# Patient Record
Sex: Female | Born: 1985 | Hispanic: No | Marital: Single | Smoking: Current every day smoker
Health system: Southern US, Community
[De-identification: ages and names within clinical notes are randomized; demographics above are authoritative.]

## PROBLEM LIST (undated history)

## (undated) DIAGNOSIS — I10 Essential (primary) hypertension: Secondary | ICD-10-CM

## (undated) DIAGNOSIS — E559 Vitamin D deficiency, unspecified: Secondary | ICD-10-CM

## (undated) DIAGNOSIS — D649 Anemia, unspecified: Secondary | ICD-10-CM

## (undated) HISTORY — DX: Anemia, unspecified: D64.9

## (undated) HISTORY — PX: TUBAL LIGATION: SHX77

## (undated) HISTORY — DX: Essential (primary) hypertension: I10

---

## 2014-04-21 ENCOUNTER — Encounter (HOSPITAL_COMMUNITY): Payer: Self-pay | Admitting: Emergency Medicine

## 2014-04-21 DIAGNOSIS — Z8639 Personal history of other endocrine, nutritional and metabolic disease: Secondary | ICD-10-CM | POA: Insufficient documentation

## 2014-04-21 DIAGNOSIS — Z0000 Encounter for general adult medical examination without abnormal findings: Secondary | ICD-10-CM | POA: Insufficient documentation

## 2014-04-21 DIAGNOSIS — Z720 Tobacco use: Secondary | ICD-10-CM | POA: Insufficient documentation

## 2014-04-21 NOTE — ED Notes (Signed)
Pt sts she was having intercourse and after she was unable to find the condom her and her partner were using. Denies pain/discomfort.

## 2014-04-22 ENCOUNTER — Emergency Department (HOSPITAL_COMMUNITY)
Admission: EM | Admit: 2014-04-22 | Discharge: 2014-04-22 | Disposition: A | Discharge status: Home / Self Care | Payer: Self-pay | Attending: Emergency Medicine | Admitting: Emergency Medicine

## 2014-04-22 DIAGNOSIS — Z0000 Encounter for general adult medical examination without abnormal findings: Secondary | ICD-10-CM

## 2014-04-22 HISTORY — DX: Vitamin D deficiency, unspecified: E55.9

## 2014-04-22 NOTE — ED Provider Notes (Signed)
CSN: [id number]     Arrival date & time 04/21/14  2254 History   First MD Initiated Contact with Patient 04/22/14 [phone]     Chief Complaint  Patient presents with  . Foreign Body in Vagina     (Consider location/radiation/quality/duration/timing/severity/associated sxs/prior Treatment) Patient is a 29 y.o. female presenting with foreign body in vagina. The history is provided by the patient. No language interpreter was used.  Foreign Body in Vagina This is a new problem. The current episode started yesterday. Pertinent negatives include no abdominal pain, fever or myalgias. Associated symptoms comments: She reports having intercourse with her friend and being unable to find the condom they were using. No vaginal discharge, pelvic pain, bleeding or abdominal pain. No dysuria or fever..    Past Medical History  Diagnosis Date  . Vitamin D deficiency disease    Past Surgical History  Procedure Laterality Date  . Cesarean section     No family history on file. History  Substance Use Topics  . Smoking status: Current Every Day Smoker  . Smokeless tobacco: Not on file  . Alcohol Use: Yes   OB History    No data available     Review of Systems  Constitutional: Negative for fever.  Gastrointestinal: Negative for abdominal pain.  Genitourinary: Negative for vaginal bleeding, vaginal discharge and pelvic pain.       See HPI.  Musculoskeletal: Negative for myalgias.      Allergies  Review of patient's allergies indicates no known allergies.  Home Medications   Prior to Admission medications   Not on File   BP 126/81 mmHg  Pulse 78  Temp(Src) 98.5 F (36.9 C) (Oral)  Resp 18  SpO2 100%  LMP 03/29/2014 (Approximate) Physical Exam  Constitutional: She is oriented to person, place, and time. She appears well-developed and well-nourished.  Neck: Normal range of motion.  Pulmonary/Chest: Effort normal.  Genitourinary:  No foreign body visualized vaginally. No discharge.  No CMT, adnexal mass or tenderness.   Neurological: She is alert and oriented to person, place, and time.  Skin: Skin is warm and dry.    ED Course  Procedures (including critical care time) Labs Review Labs Reviewed - No data to display  Imaging Review No results found.   EKG Interpretation None      MDM   Final diagnoses:  None    1. Normal exam  No foreign body found.     Arnoldo HookerShari A Shaivi Rothschild, PA-C 04/22/14 0345  Tomasita CrumbleAdeleke Oni, MD 04/22/14 [id number]

## 2014-04-22 NOTE — ED Notes (Signed)
PA at bedside for pelvic exam.

## 2014-04-22 NOTE — Discharge Instructions (Signed)
FOLLOW UP WITH A PRIMARY CARE PROVIDER OR RETURN HERE WITH ANY NEW CONCERNS.

## 2014-06-07 ENCOUNTER — Encounter (HOSPITAL_COMMUNITY): Payer: Self-pay | Admitting: *Deleted

## 2014-06-07 ENCOUNTER — Emergency Department (HOSPITAL_COMMUNITY): Payer: Self-pay

## 2014-06-07 ENCOUNTER — Emergency Department (HOSPITAL_COMMUNITY)
Admission: EM | Admit: 2014-06-07 | Discharge: 2014-06-07 | Disposition: A | Discharge status: Home / Self Care | Payer: Self-pay | Attending: Emergency Medicine | Admitting: Emergency Medicine

## 2014-06-07 DIAGNOSIS — Z8639 Personal history of other endocrine, nutritional and metabolic disease: Secondary | ICD-10-CM | POA: Insufficient documentation

## 2014-06-07 DIAGNOSIS — J069 Acute upper respiratory infection, unspecified: Secondary | ICD-10-CM | POA: Insufficient documentation

## 2014-06-07 DIAGNOSIS — N898 Other specified noninflammatory disorders of vagina: Secondary | ICD-10-CM | POA: Insufficient documentation

## 2014-06-07 DIAGNOSIS — Z3202 Encounter for pregnancy test, result negative: Secondary | ICD-10-CM | POA: Insufficient documentation

## 2014-06-07 DIAGNOSIS — Z720 Tobacco use: Secondary | ICD-10-CM | POA: Insufficient documentation

## 2014-06-07 LAB — RAPID HIV SCREEN (HIV 1/2 AB+AG)
HIV 1/2 Antibodies: NONREACTIVE
HIV-1 P24 Antigen - HIV24: NONREACTIVE

## 2014-06-07 LAB — URINALYSIS, ROUTINE W REFLEX MICROSCOPIC
Bilirubin Urine: NEGATIVE
Glucose, UA: NEGATIVE mg/dL
HGB URINE DIPSTICK: NEGATIVE
Ketones, ur: NEGATIVE mg/dL
Leukocytes, UA: NEGATIVE
Nitrite: NEGATIVE
PROTEIN: NEGATIVE mg/dL
Specific Gravity, Urine: 1.003 — ABNORMAL LOW (ref 1.005–1.030)
UROBILINOGEN UA: 0.2 mg/dL (ref 0.0–1.0)
pH: 6.5 (ref 5.0–8.0)

## 2014-06-07 LAB — WET PREP, GENITAL
Trich, Wet Prep: NONE SEEN
WBC WET PREP: NONE SEEN
YEAST WET PREP: NONE SEEN

## 2014-06-07 LAB — POC URINE PREG, ED: Preg Test, Ur: NEGATIVE

## 2014-06-07 MED ORDER — IBUPROFEN 800 MG PO TABS: 800.0000 mg | ORAL_TABLET | Freq: Three times a day (TID) | ORAL | Status: DC | PRN

## 2014-06-07 MED ORDER — AZITHROMYCIN 250 MG PO TABS
1000.0000 mg | ORAL_TABLET | Freq: Once | ORAL | Status: AC
  Administered 2014-06-07: 1000 mg via ORAL
  Filled 2014-06-07: qty 4

## 2014-06-07 MED ORDER — CEFTRIAXONE SODIUM 250 MG IJ SOLR
250.0000 mg | Freq: Once | INTRAMUSCULAR | Status: AC
  Administered 2014-06-07: 250 mg via INTRAMUSCULAR
  Filled 2014-06-07: qty 250

## 2014-06-07 MED ORDER — GUAIFENESIN ER 1200 MG PO TB12: 1.0000 | ORAL_TABLET | Freq: Two times a day (BID) | ORAL | Status: DC

## 2014-06-07 MED ORDER — ACETAMINOPHEN-CODEINE 120-12 MG/5ML PO SOLN: 10.0000 mL | ORAL | Status: DC | PRN

## 2014-06-07 MED ORDER — LIDOCAINE HCL (PF) 1 % IJ SOLN
5.0000 mL | Freq: Once | INTRAMUSCULAR | Status: AC
  Administered 2014-06-07: 5 mL
  Filled 2014-06-07: qty 5

## 2014-06-07 NOTE — ED Notes (Signed)
The pt has taken nher meds

## 2014-06-07 NOTE — ED Provider Notes (Signed)
CSN: [id number]     Arrival date & time 06/07/14  1631 History   First MD Initiated Contact with Patient 06/07/14 1755     Chief Complaint  Patient presents with  . Sore Throat  . Nasal Congestion  . Vaginal Discharge     (Consider location/radiation/quality/duration/timing/severity/associated sxs/prior Treatment) HPI   Ms. Tiffany Weiss is a 29 yo female who presents to the ED with chest congestion, non-productive cough, subjective fever and chills x 1 day and 2 days of vaginal discharge. She also complains of sore throat and mild joint aches that started this morning. Her daughter has been sick recently. She took a Zyrtec at home which did not relieve her symptoms. She denies SOB, chest pain, headache, dizziness, syncope, n/d/c, abdominal pain. She denies any vaginal pruritus, pain, rash or lesions. Nor urinary frequency or dysuria. She does not believe she was exposed to STIs but does desire testing today.   Past Medical History  Diagnosis Date  . Vitamin D deficiency disease    Past Surgical History  Procedure Laterality Date  . Cesarean section     No family history on file. History  Substance Use Topics  . Smoking status: Current Every Day Smoker  . Smokeless tobacco: Not on file  . Alcohol Use: Yes   OB History    No data available     Review of Systems  All other systems negative except as documented in the HPI. All pertinent positives and negatives as reviewed in the HPI.   Allergies  Review of patient's allergies indicates no known allergies.  Home Medications   Prior to Admission medications   Not on File   BP 134/90 mmHg  Pulse 90  Temp(Src) 99.7 F (37.6 C) (Oral)  Resp 16  Wt 166 lb 1.6 oz (75.342 kg)  SpO2 97% Physical Exam  Constitutional: She is oriented to person, place, and time. She appears well-developed.  HENT:  Mouth/Throat: Oropharynx is clear and moist and mucous membranes are normal.  Eyes: Conjunctivae are normal. Pupils are equal, round,  and reactive to light.  Neck: Neck supple. No thyromegaly present.  Cardiovascular: Normal rate, regular rhythm, S1 normal and S2 normal.   Pulmonary/Chest: Effort normal and breath sounds normal. She has no rhonchi. She has no rales.  Abdominal: Soft. Bowel sounds are normal. There is no tenderness.  Genitourinary: Uterus normal. Cervix exhibits no motion tenderness. Vaginal discharge found.  No adnexal tenderness. No rash or lesions on the labia  Lymphadenopathy:    She has no cervical adenopathy.  Neurological: She is alert and oriented to person, place, and time.  Skin: Skin is warm and dry.    ED Course  Procedures (including critical care time) Labs Review Labs Reviewed  URINALYSIS, ROUTINE W REFLEX MICROSCOPIC - Abnormal; Notable for the following:    Specific Gravity, Urine 1.003 (*)    All other components within normal limits  WET PREP, GENITAL  RPR  RAPID HIV SCREEN (HIV 1/2 AB+AG)  POC URINE PREG, ED  GC/CHLAMYDIA PROBE AMP ([place])    Patient be treated for possible STD.  Told to return here as needed.  Told to follow up with a GYN doctor MDM   Final diagnoses:  URI (upper respiratory infection)       Charlestine Nighthristopher Kaitlin Ardito, PA-C 06/10/14 [id number]  Glynn OctaveStephen Rancour, MD 06/10/14 [phone]

## 2014-06-07 NOTE — ED Notes (Signed)
Pt child being seen first.

## 2014-06-07 NOTE — ED Notes (Signed)
Pelvic performed.  Specs to the lab

## 2014-06-07 NOTE — Discharge Instructions (Signed)
Return here as needed. Follow up with your doctor. °

## 2014-06-07 NOTE — ED Notes (Signed)
Pt is c/o congestion, cough.  She said she had a fever today.  Pt is also requesting a STD check and HIV test.  Denies any abd pain.  Says she has had some vaginal discharge.  Pt is c/o generalized body aches.  She took a zyrtec today.  Pt took aspirin about 1 hour ago.

## 2014-06-08 LAB — GC/CHLAMYDIA PROBE AMP (~~LOC~~) NOT AT ARMC
Chlamydia: NEGATIVE
NEISSERIA GONORRHEA: NEGATIVE

## 2014-06-08 LAB — RPR: RPR Ser Ql: NONREACTIVE

## 2014-07-13 ENCOUNTER — Emergency Department (HOSPITAL_COMMUNITY)
Admission: EM | Admit: 2014-07-13 | Discharge: 2014-07-13 | Disposition: A | Discharge status: Home / Self Care | Payer: Self-pay | Attending: Emergency Medicine | Admitting: Emergency Medicine

## 2014-07-13 ENCOUNTER — Encounter (HOSPITAL_COMMUNITY): Payer: Self-pay | Admitting: Emergency Medicine

## 2014-07-13 ENCOUNTER — Emergency Department (HOSPITAL_COMMUNITY): Payer: Self-pay

## 2014-07-13 DIAGNOSIS — Z792 Long term (current) use of antibiotics: Secondary | ICD-10-CM | POA: Insufficient documentation

## 2014-07-13 DIAGNOSIS — M79674 Pain in right toe(s): Secondary | ICD-10-CM | POA: Insufficient documentation

## 2014-07-13 DIAGNOSIS — M25511 Pain in right shoulder: Secondary | ICD-10-CM | POA: Insufficient documentation

## 2014-07-13 DIAGNOSIS — Z720 Tobacco use: Secondary | ICD-10-CM | POA: Insufficient documentation

## 2014-07-13 MED ORDER — IBUPROFEN 800 MG PO TABS: 800.0000 mg | ORAL_TABLET | Freq: Three times a day (TID) | ORAL | Status: DC

## 2014-07-13 MED ORDER — CEPHALEXIN 500 MG PO CAPS: 500.0000 mg | ORAL_CAPSULE | Freq: Four times a day (QID) | ORAL | Status: DC

## 2014-07-13 NOTE — Discharge Instructions (Signed)
Arthralgia °Your caregiver has diagnosed you as suffering from an arthralgia. Arthralgia means there is pain in a joint. This can come from many reasons including: °· Bruising the joint which causes soreness (inflammation) in the joint. °· Wear and tear on the joints which occur as we grow older (osteoarthritis). °· Overusing the joint. °· Various forms of arthritis. °· Infections of the joint. °Regardless of the cause of pain in your joint, most of these different pains respond to anti-inflammatory drugs and rest. The exception to this is when a joint is infected, and these cases are treated with antibiotics, if it is a bacterial infection. °HOME CARE INSTRUCTIONS  °· Rest the injured area for as long as directed by your caregiver. Then slowly start using the joint as directed by your caregiver and as the pain allows. Crutches as directed may be useful if the ankles, knees or hips are involved. If the knee was splinted or casted, continue use and care as directed. If an stretchy or elastic wrapping bandage has been applied today, it should be removed and re-applied every 3 to 4 hours. It should not be applied tightly, but firmly enough to keep swelling down. Watch toes and feet for swelling, bluish discoloration, coldness, numbness or excessive pain. If any of these problems (symptoms) occur, remove the ace bandage and re-apply more loosely. If these symptoms persist, contact your caregiver or return to this location. °· For the first 24 hours, keep the injured extremity elevated on pillows while lying down. °· Apply ice for 15-20 minutes to the sore joint every couple hours while awake for the first half day. Then 03-04 times per day for the first 48 hours. Put the ice in a plastic bag and place a towel between the bag of ice and your skin. °· Wear any splinting, casting, elastic bandage applications, or slings as instructed. °· Only take over-the-counter or prescription medicines for pain, discomfort, or fever as  directed by your caregiver. Do not use aspirin immediately after the injury unless instructed by your physician. Aspirin can cause increased bleeding and bruising of the tissues. °· If you were given crutches, continue to use them as instructed and do not resume weight bearing on the sore joint until instructed. °Persistent pain and inability to use the sore joint as directed for more than 2 to 3 days are warning signs indicating that you should see a caregiver for a follow-up visit as soon as possible. Initially, a hairline fracture (break in bone) may not be evident on X-rays. Persistent pain and swelling indicate that further evaluation, non-weight bearing or use of the joint (use of crutches or slings as instructed), or further X-rays are indicated. X-rays may sometimes not show a small fracture until a week or 10 days later. Make a follow-up appointment with your own caregiver or one to whom we have referred you. A radiologist (specialist in reading X-rays) may read your X-rays. Make sure you know how you are to obtain your X-ray results. Do not assume everything is normal if you do not hear from us. °SEEK MEDICAL CARE IF: °Bruising, swelling, or pain increases. °SEEK IMMEDIATE MEDICAL CARE IF:  °· Your fingers or toes are numb or blue. °· The pain is not responding to medications and continues to stay the same or get worse. °· The pain in your joint becomes severe. °· You develop a fever over 102° F (38.9° C). °· It becomes impossible to move or use the joint. °MAKE SURE YOU:  °·   Understand these instructions. °· Will watch your condition. °· Will get help right away if you are not doing well or get worse. °Document Released: 02/03/2005 Document Revised: 04/28/2011 Document Reviewed: 09/22/2007 °ExitCare® Patient Information ©2015 [employer]. This information is not intended to replace advice given to you by your health care provider. Make sure you discuss any questions you have with your health care  provider. ° ° ° °Emergency Department Resource Guide °1) Find a Doctor and Pay Out of Pocket °Although you won't have to find out who is covered by your insurance plan, it is a good idea to ask around and get recommendations. You will then need to call the office and see if the doctor you have chosen will accept you as a new patient and what types of options they offer for patients who are self-pay. Some doctors offer discounts or will set up payment plans for their patients who do not have insurance, but you will need to ask so you aren't surprised when you get to your appointment. ° °2) Contact Your Local Health Department °Not all health departments have doctors that can see patients for sick visits, but many do, so it is worth a call to see if yours does. If you don't know where your local health department is, you can check in your phone book. The CDC also has a tool to help you locate your state's health department, and many state websites also have listings of all of their local health departments. ° °3) Find a Walk-in Clinic °If your illness is not likely to be very severe or complicated, you may want to try a walk in clinic. These are popping up all over the country in pharmacies, drugstores, and shopping centers. They're usually staffed by nurse practitioners or physician assistants that have been trained to treat common illnesses and complaints. They're usually fairly quick and inexpensive. However, if you have serious medical issues or chronic medical problems, these are probably not your best option. ° °No Primary Care Doctor: °- Call [facility] at  832-8000 - they can help you locate a primary care doctor that  accepts your insurance, provides certain services, etc. °- Physician Referral Service- 1-800-533-3463 ° °Chronic Pain Problems: °Organization         Address  Phone   Notes  ° [facility]  (336) 297-2271 Patients need to be referred by their primary care doctor.   ° °Medication Assistance: °Organization         Address  Phone   Notes  °[place] [facility] [address] °[place], [place] [address] (336) 641-8030 --Must be a resident of [place] °-- Must have NO insurance coverage whatsoever (no Medicaid/ Medicare, etc.) °-- The pt. MUST have a primary care doctor that directs their care regularly and follows them in the community °  °[facility]  (866) 331-1348   °[employer]  (888) 892-1162   ° °Agencies that provide inexpensive medical care: °Organization         Address  Phone   Notes  °[facility]  (336) 832-8035   °[facility]    (336) 832-7272   °[facility] [address] °[place], [place] [address] (336) 832-4777   °[facility] [address], °[place] (336) 271-4999   °[facility]    (336) 373-0678   °[facility]    (336) 272-1050   °[facility] °   [address], [place] Phone:  (336) 832-4444, Fax:  (336) 832-4440 Hours of Operation:  9 am - 6 pm, M-F.  Also accepts Medicaid/Medicare and self-pay.  °[facility] ° [address], [place] Phone: (336) 832-3150, Fax: (336) [phone]. Hours of Operation:  8:30 am - 5:30 pm, M-F.  Also accepts Medicaid and self-pay.  °[facility] [place] [address], [place] Phone: (336) 878-6027   °[facility] [address], [place], [place] (336)723-1848, Ext. 123 Mondays & Thursdays: 7-9 AM.  First 15 patients are seen on a first come, first serve basis. °  ° °Medicaid-accepting [place] Providers: ° °Organization         Address  Phone   Notes  °[facility] [address], [place] (336) 641-2100 Also accepts self-pay patients.  °[facility] [address], [place] ° (336) 856-9996   °[facility] [address], [place] (336) 288-8857   °[facility] [address] [place] [address], [place] (336) 299-7000   °Veita Bland [address], [place]  ° (336) 373-1557 Only accepts [place] Access Medicaid patients after they have their name applied to their card.  ° °Self-Pay (no insurance) in [place]: ° °Organization         Address  Phone   Notes  °Sickle Cell Patients, [facility] [address], [place] (336) 832-1970   °[facility] [address], [place] (336) 832-4400   °[facility] ° [address], [place] (336) 992-4800   °[facility]/Dr. Osei-Bonsu ° [address] [place] [address], [place] or [address], [place] (336) [phone] Phone number for both [place] and [place] locations is the same.  °[facility] [address], [place] (336) 299-0000   °[facility] [address] [place] [address], [place] or [address] (336) 852-7530 °(336) 878-2260   °[facility] [address], [place] (336) 350-1642, phone; (336) 294-5005, fax Sees patients 1st and 3rd Saturday of every month.  Must not qualify for public or private insurance (i.e. Medicaid, Medicare, [place] Health Choice, Veterans' Benefits) • Household income should be no more than 200% of the poverty level •The clinic cannot treat you if you are pregnant or think you are pregnant • Sexually transmitted diseases are not treated at the clinic.  ° ° °Dental Care: °Organization         Address  Phone  Notes  °[facility] [address], [place] (336) 641-6152 Accepts children up to age 21 who are enrolled in Medicaid or [place] Health Choice; pregnant women with a Medicaid card; and children who have applied for Medicaid or [place] Health Choice, but were declined, whose parents can pay a reduced fee at time of service.    °[facility] [place]  [address], [place] (336) 641-7733 Accepts children up to age 21 who are enrolled in Medicaid or [place] Health Choice; pregnant women with a Medicaid card; and children who have applied for Medicaid or [place] Health Choice, but were declined, whose parents can pay a reduced fee at time of service.  °[facility] ° [address],  (336)   641-4533 Patients are seen by appointment only. Walk-ins are not accepted. [facility] will see patients 18 years of age and older. °Monday - Tuesday (8am-5pm) °Most Wednesdays (8:30-5pm) °$30 per visit, cash only  °[facility] ° [address], [place] (336) 641-4533 Patients are seen by appointment only. Walk-ins are not accepted. [facility] will see patients 18 years of age and older. °One Wednesday Evening (Monthly: Volunteer Based).  $30 per visit, cash only  °[facility]  (919) 537-3737 for adults; Children under age 4, call Graduate Pediatric Dentistry at (919) [phone]. Children aged 4-14, please call (919) [phone] to request a pediatric application. ° Dental services are provided in all areas of dental care including fillings, crowns and bridges, complete and partial dentures, implants, gum treatment, root canals, and extractions. Preventive care is also provided. Treatment is provided to both adults and children. °Patients are selected via a lottery and there is often a waiting list. °  °[facility] [address], °[place] ° (336) 763-8833 [email or web address] °  °[facility] [address], [place], [place] (336)723-1848, Ext. 123 Second and Fourth Thursday of each month, opens at 6:30 AM; Clinic ends at 9 AM.  Patients are seen on a first-come first-served basis, and a limited number are seen during each clinic.  ° °[facility] ° [address], [place], [place] (336)  723-7904   Eligibility Requirements °You must have lived in [place], [place], or [place] counties for at least the last three months. °  You cannot be eligible for state or federal sponsored healthcare insurance, including Veterans Administration, Medicaid, or Medicare. °  You generally cannot be eligible for healthcare insurance through your employer.  °  How to apply: °Eligibility screenings are held every Tuesday and Wednesday afternoon from 1:00 pm until 4:00 pm. You do not need an appointment for the interview!  °[facility] [address], [place], [place] 336-631-2330   °[facility]  336-342-8273   °[facility]  336-703-3100   °[facility]  336-570-6415   ° °Behavioral Health Resources in the Community: °Intensive Outpatient Programs °Organization         Address  Phone  Notes  °[facility] [address], [place], [place] 336-878-6098   °[facility] [address], [place], [place] 336-832-9800   °[facility] [address], [place], [place] ° 336-882-2125   °[facility] [address],  °[place], [place] 1-800-853-5163 or 336-641-4981   °Substance Abuse Resources °Organization         Address  Phone  Notes  °[facility]  336-882-2125   °[facility]  336-784-9470   °[facility]  336-285-9073   °[facility]  336-845-3988   °Residential & Outpatient Substance Abuse Program  1-800-659-3381   °Psychological Services °Organization         Address  Phone  Notes  °[facility]  336- 832-9600   °[facility]  336- 378-7881   °[facility] [address], [place] 1-800-853-5163 or 336-641-4981   ° °Mobile Crisis Teams °Organization         Address  Phone  Notes  °[facility], Mobile Crisis Care Unit  1-877-626-1772   °Assertive °[facility] ° [address] [place], [place] 336-834-9664   °Sharon DeEsch [address] °[place] 336-554-5454   ° °Self-Help/Support Groups °Organization         Address  Phone             Notes  °[facility] [place] - variety of support groups  336- 373-1402 Call for more information  °Narcotics Anonymous (NA), [facility] [address], °[place]  2 meetings at this location  ° °Residential Treatment Programs °Organization         Address  Phone  Notes  °[facility] [address],    °[place]  1-866-801-8205   °[facility] ° [address], [place], [place] 704-293-8524   °[facility] [address], [place] 336-845-3988 Admissions: 8am-3pm M-F  °[facility] [address]    °[place], [place] 336-841-1104   °[facility] [address], [place], [place] 336-379-7146   °[facility] [address]  °[place], [place] 336-285-9073   °[facility] - Intensive Outpatient [address], , [place] 336-852-3033   °[facility] [address]  °[place], [place] 1-877-615-2722 or 336-784-9470   °[facility] [address] [place], [place] 336-227-7417 Accepts Medicaid  °[facility] [address]  ° [place] 1-800-659-3381 Substance Abuse/Addiction Treatment  ° °[place] Behavioral Health Resources °Organization         Address  Phone  Notes  °[facility]  (888) 581-9988   °Julie Brannon, PhD [address] [place], [place]   (336) 349-5553 or (336) 951-0000   °[facility]   [address] °[place], [place] (336) 349-4454   °[facility] [address], [place], [place] (336) 342-8316 Insurance/Medicaid/sponsorship through [facility]  °[facility] [address]                                    [place], [place] (336) 342-8316  Therapy/tele-psych/case  °[facility] [address]  ° [place], [place] (336) 349-2233    °Dr. Arfeen  (336) 349-4544   °[facility] [place]  [employer] [place] [facility] 1) [address], [place] °2) [address], [place] °3)  [address], [place] (336) 349-3220 °(336) 342-7768 ° °(336) 342-8140   °[place] Child Abuse Hotline (336) 342-1394 or (336) 342-3537 (After Hours)    ° ° ° ° °

## 2014-07-13 NOTE — ED Provider Notes (Signed)
CSN: [id number]     Arrival date & time 07/13/14  1313 History  This chart was scribed for non-physician practitioner, Jinny Sanders, PA-C working with Pricilla Loveless, MD, by Abel Presto, ED Scribe. This patient was seen in room TR10C/TR10C and the patient's care was started at 2:02 PM.      Chief Complaint  Patient presents with  . Toe Pain  . Shoulder Pain     The history is provided by the patient. No language interpreter was used.   HPI Comments: Tiffany Weiss is a 29 y.o. female who presents to the Emergency Department complaining of right second toe pain with onset 2 weeks ago. Pt notes associated swelling. She reports there is a "knot" on her toe and notes limited ROM. Pt denies h/o gout.  Pt also reports right shoulder pain with onset 2 weeks ago. Pt denies any known injury to either area, fever, numbness, and weakness.   Past Medical History  Diagnosis Date  . Vitamin D deficiency disease    Past Surgical History  Procedure Laterality Date  . Cesarean section     No family history on file. History  Substance Use Topics  . Smoking status: Current Every Day Smoker  . Smokeless tobacco: Not on file  . Alcohol Use: Yes   OB History    No data available     Review of Systems  Constitutional: Negative for fever and chills.  Musculoskeletal: Positive for myalgias, joint swelling and arthralgias.  Neurological: Negative for weakness and numbness.      Allergies  Review of patient's allergies indicates no known allergies.  Home Medications   Prior to Admission medications   Medication Sig Start Date End Date Taking? Authorizing Provider  acetaminophen-codeine 120-12 MG/5ML solution Take 10 mLs by mouth every 4 (four) hours as needed for moderate pain. 06/07/14   Christopher Lawyer, PA-C  cephALEXin (KEFLEX) 500 MG capsule Take 1 capsule (500 mg total) by mouth 4 (four) times daily. 07/13/14   Ladona Mow, PA-C  Guaifenesin 1200 MG TB12 Take 1 tablet (1,200 mg  total) by mouth 2 (two) times daily. 06/07/14   Charlestine Night, PA-C  ibuprofen (ADVIL,MOTRIN) 800 MG tablet Take 1 tablet (800 mg total) by mouth 3 (three) times daily. 07/13/14   Ladona Mow, PA-C   BP 136/87 mmHg  Pulse 73  Temp(Src) 98.1 F (36.7 C) (Oral)  Resp 16  Ht  (1.575 m)  Wt 160 lb (72.576 kg)  BMI 29.26 kg/m2  SpO2 100%  LMP 06/22/2014 Physical Exam  Constitutional: She is oriented to person, place, and time. She appears well-developed and well-nourished.  HENT:  Head: Normocephalic.  Eyes: Conjunctivae are normal.  Neck: Normal range of motion. Neck supple.  Cardiovascular:  Pulses:      Radial pulses are 2+ on the right side.       Dorsalis pedis pulses are 2+ on the right side.  Cap refill <2 sec  Pulmonary/Chest: Effort normal.  Musculoskeletal: Normal range of motion.  Right second toe exam: Mild tenderness to the MTP joint of the right 2nd toe. Mild redness noted to medial border of phalanx of toe. Erythema does not appear to be confined to any particular joint. Capillary refill less than 2 seconds. DP pulse 2+. Distal sensation intact.  Right shoulder exam: Full active and passive range of motion of shoulder without pain. No point tenderness. Pain with T4 internal rotation. Negative empty can test. No pain elicited with forced internal/external rotation. No  signs of impingement. 5/5 motor strength in shoulder, elbow, and wrist No point tenderness. Distal sensation intact.  Neurological: She is alert and oriented to person, place, and time.  Distal sensations intact  Skin: Skin is warm and dry.  Psychiatric: She has a normal mood and affect. Her behavior is normal.  Nursing note and vitals reviewed.   ED Course  Procedures (including critical care time) DIAGNOSTIC STUDIES: Oxygen Saturation is 100% on room air, normal by my interpretation.    COORDINATION OF CARE: 2:06 PM Discussed treatment plan with patient at beside, the patient agrees with  the plan and has no further questions at this time.   Labs Review Labs Reviewed - No data to display  Imaging Review Dg Foot Complete Right  07/13/2014   CLINICAL DATA:  Second toe pain and swelling for 2 weeks  EXAM: RIGHT FOOT COMPLETE - 3+ VIEW  COMPARISON:  None.  FINDINGS: Three views of the right foot submitted. No acute fracture or subluxation. No radiopaque foreign body.  IMPRESSION: Negative.   Electronically Signed   By: Natasha MeadLiviu  Pop M.D.   On: 07/13/2014 14:41     EKG Interpretation None      MDM   Final diagnoses:  Toe pain, right  Shoulder pain, acute, right   1. Shoulder pain Patient's shoulder pain atraumatic, there is no obvious point tenderness. Patient neurovascularly intact, does not have any signs or symptoms consistent with a rotator cuff tear, mild arthralgias suspected, although without any point tenderness do not believe imaging is required at this time. Patient has full range of motion of arm without pain. Musculoskeletal exam is benign with patient's arm. Encouraged use of ibuprofen, ice, resting, follow-up with PCP.  2. Toe pain Toe pain is also atraumatic, there is some mild redness along patient's medial border of her toe, this extends between the joints, do not believe there is septic arthritis. Considered gout, although a monoarticular gout in the second MTP joint would be rare, encouraged use of ibuprofen for this. We'll place patient on a short course of Keflex in the event there is a small skin infection. No gross abscess.  Patient afebrile, hemodynamically stable, well-appearing and in no acute distress. Patient stable for discharge, and strongly encouraged patient follow up with a primary care physician. Discussed return precautions with patient, patient verbalizes understanding and agreement with this plan.  I personally performed the services described in this documentation, which was scribed in my presence. The recorded information has been reviewed  and is accurate.  BP 136/87 mmHg  Pulse 73  Temp(Src) 98.1 F (36.7 C) (Oral)  Resp 16  Ht 5\' 2"  (1.575 m)  Wt 160 lb (72.576 kg)  BMI 29.26 kg/m2  SpO2 100%  LMP 06/22/2014  Signed,  Ladona MowJoe Inioluwa Boulay, PA-C 12:49 AM    Ladona MowJoe Kaven Cumbie, PA-C 07/14/14 [id number]  Pricilla LovelessScott Goldston, MD 07/15/14 [phone]

## 2014-07-13 NOTE — ED Notes (Signed)
Pt c/o right second toe pain and swelling x 2 weeks. Also c/o right shoulder pain. No known injury.

## 2014-07-13 NOTE — ED Notes (Signed)
Pt reports pain to R middle toe and R shoulder pain for a couple days. Pt does not remember any specific injury but sts she has been moving.

## 2015-06-15 ENCOUNTER — Emergency Department (HOSPITAL_COMMUNITY)
Admission: EM | Admit: 2015-06-15 | Discharge: 2015-06-15 | Disposition: A | Discharge status: Home / Self Care | Payer: Medicaid Other | Attending: Emergency Medicine | Admitting: Emergency Medicine

## 2015-06-15 ENCOUNTER — Encounter (HOSPITAL_COMMUNITY): Payer: Self-pay | Admitting: *Deleted

## 2015-06-15 DIAGNOSIS — J020 Streptococcal pharyngitis: Secondary | ICD-10-CM | POA: Insufficient documentation

## 2015-06-15 DIAGNOSIS — J029 Acute pharyngitis, unspecified: Secondary | ICD-10-CM | POA: Diagnosis present

## 2015-06-15 DIAGNOSIS — F17200 Nicotine dependence, unspecified, uncomplicated: Secondary | ICD-10-CM | POA: Diagnosis not present

## 2015-06-15 DIAGNOSIS — Z792 Long term (current) use of antibiotics: Secondary | ICD-10-CM | POA: Diagnosis not present

## 2015-06-15 DIAGNOSIS — Z8639 Personal history of other endocrine, nutritional and metabolic disease: Secondary | ICD-10-CM | POA: Diagnosis not present

## 2015-06-15 DIAGNOSIS — Z79899 Other long term (current) drug therapy: Secondary | ICD-10-CM | POA: Diagnosis not present

## 2015-06-15 DIAGNOSIS — Z791 Long term (current) use of non-steroidal anti-inflammatories (NSAID): Secondary | ICD-10-CM | POA: Diagnosis not present

## 2015-06-15 MED ORDER — AMOXICILLIN 500 MG PO CAPS: 500.0000 mg | ORAL_CAPSULE | Freq: Three times a day (TID) | ORAL | Status: DC

## 2015-06-15 MED ORDER — AMOXICILLIN 500 MG PO CAPS
500.0000 mg | ORAL_CAPSULE | Freq: Once | ORAL | Status: AC
  Administered 2015-06-15: 500 mg via ORAL
  Filled 2015-06-15: qty 1

## 2015-06-15 NOTE — ED Provider Notes (Signed)
CSN: [id number]     Arrival date & time 06/15/15  4098 History   First MD Initiated Contact with Patient 06/15/15 [phone]     Chief Complaint  Patient presents with  . Sore Throat     (Consider location/radiation/quality/duration/timing/severity/associated sxs/prior Treatment) HPI   Blood pressure 123/74, pulse 81, temperature 98.6 F (37 C), temperature source Oral, resp. rate 18, last menstrual period 05/19/2015, SpO2 100 %.  TIFFNY GEMMER is a 30 y.o. female complaining of sore throat, tactile fever and chills onset yesterday. Patient denies cough, nausea, vomiting, change in bowel or bladder habits. No sick contacts, rash, headache, cervicalgia.  Past Medical History  Diagnosis Date  . Vitamin D deficiency disease    Past Surgical History  Procedure Laterality Date  . Cesarean section     History reviewed. No pertinent family history. Social History  Substance Use Topics  . Smoking status: Current Every Day Smoker  . Smokeless tobacco: None  . Alcohol Use: Yes   OB History    No data available     Review of Systems  10 systems reviewed and found to be negative, except as noted in the HPI.   Allergies  Review of patient's allergies indicates no known allergies.  Home Medications   Prior to Admission medications   Medication Sig Start Date End Date Taking? Authorizing Provider  acetaminophen-codeine 120-12 MG/5ML solution Take 10 mLs by mouth every 4 (four) hours as needed for moderate pain. 06/07/14   Christopher Lawyer, PA-C  cephALEXin (KEFLEX) 500 MG capsule Take 1 capsule (500 mg total) by mouth 4 (four) times daily. 07/13/14   Ladona Mow, PA-C  Guaifenesin 1200 MG TB12 Take 1 tablet (1,200 mg total) by mouth 2 (two) times daily. 06/07/14   Charlestine Night, PA-C  ibuprofen (ADVIL,MOTRIN) 800 MG tablet Take 1 tablet (800 mg total) by mouth 3 (three) times daily. 07/13/14   Ladona Mow, PA-C   BP 123/74 mmHg  Pulse 81  Temp(Src) 98.6 F (37 C) (Oral)  Resp 18   SpO2 100%  LMP 05/19/2015 Physical Exam  Constitutional: She is oriented to person, place, and time. She appears well-developed and well-nourished. No distress.  HENT:  Head: Normocephalic and atraumatic.  Mouth/Throat: Oropharynx is clear and moist.  Bilateral tonsillar hypertrophy 2+ with exudate, uvula midline, soft palate rises symmetrically, patient is handling her secretions.   Anterior cervical lymphadenopathy, tender.   Eyes: Conjunctivae and EOM are normal. Pupils are equal, round, and reactive to light.  Neck: Normal range of motion.  Cardiovascular: Normal rate, regular rhythm and intact distal pulses.   Pulmonary/Chest: Effort normal and breath sounds normal.  Abdominal: Soft. There is no tenderness.  Musculoskeletal: Normal range of motion.  Neurological: She is alert and oriented to person, place, and time.  Skin: She is not diaphoretic.  Psychiatric: She has a normal mood and affect.  Nursing note and vitals reviewed.   ED Course  Procedures (including critical care time) Labs Review Labs Reviewed - No data to display  Imaging Review No results found. I have personally reviewed and evaluated these images and lab results as part of my medical decision-making.   EKG Interpretation None      MDM   Final diagnoses:  Strep pharyngitis    Filed Vitals:   06/15/15 0734  BP: 123/74  Pulse: 81  Temp: 98.6 F (37 C)  TempSrc: Oral  Resp: 18  SpO2: 100%    Medications  amoxicillin (AMOXIL) capsule 500 mg (not administered)  Nichola SizerKeandra A Crittendon is 30 y.o. female presenting with Pharyngitis. No signs of PTA, RPA. Patient overall well-appearing and handling her secretions. Physical exam consistent with strep, recommend Motrin and Tylenol for pain control, will be started on amoxicillin for strep pharyngitis.  Evaluation does not show pathology that would require ongoing emergent intervention or inpatient treatment. Pt is hemodynamically stable and mentating  appropriately. Discussed findings and plan with patient/guardian, who agrees with care plan. All questions answered. Return precautions discussed and outpatient follow up given.   New Prescriptions   AMOXICILLIN (AMOXIL) 500 MG CAPSULE    Take 1 capsule (500 mg total) by mouth 3 (three) times daily.         Wynetta Emeryicole Ezariah Nace, PA-C 06/15/15 0800  Leta BaptistEmily Roe Nguyen, MD 06/23/15 [phone]

## 2015-06-15 NOTE — ED Notes (Signed)
Agree with PA assessment 

## 2015-06-15 NOTE — Discharge Instructions (Signed)
For pain control you may take:   of ibuprofen (that is usually 4 over the counter pills)  3 times a day (take with food) and acetaminophen  (this is 3 over the counter pills) four times a day. Do not drink alcohol or combine with other medications that have acetaminophen as an ingredient (Read the labels!).      Take your antibiotics as directed and to completion. You should never have any leftover antibiotics! Push fluids and stay well hydrated.   Any antibiotic use can reduce the efficacy of hormonal birth control. Please use back up method of contraception.   Do not hesitate to return to the emergency room for any new, worsening or concerning symptoms.  Please obtain primary care using resource guide below. Let them know that you were seen in the emergency room and that they will need to obtain records for further outpatient management.    Strep Throat Strep throat is a bacterial infection of the throat. Your health care provider may call the infection tonsillitis or pharyngitis, depending on whether there is swelling in the tonsils or at the back of the throat. Strep throat is most common during the cold months of the year in children who are 21-51 years of age, but it can happen during any season in people of any age. This infection is spread from person to person (contagious) through coughing, sneezing, or close contact. CAUSES Strep throat is caused by the bacteria called Streptococcus pyogenes. RISK FACTORS This condition is more likely to develop in:  People who spend time in crowded places where the infection can spread easily.  People who have close contact with someone who has strep throat. SYMPTOMS Symptoms of this condition include:  Fever or chills.   Redness, swelling, or pain in the tonsils or throat.  Pain or difficulty when swallowing.  White or yellow spots on the tonsils or throat.  Swollen, tender glands in the neck or under the jaw.  Red rash all  over the body (rare). DIAGNOSIS This condition is diagnosed by performing a rapid strep test or by taking a swab of your throat (throat culture test). Results from a rapid strep test are usually ready in a few minutes, but throat culture test results are available after one or two days. TREATMENT This condition is treated with antibiotic medicine. HOME CARE INSTRUCTIONS Medicines  Take over-the-counter and prescription medicines only as told by your health care provider.  Take your antibiotic as told by your health care provider. Do not stop taking the antibiotic even if you start to feel better.  Have family members who also have a sore throat or fever tested for strep throat. They may need antibiotics if they have the strep infection. Eating and Drinking  Do not share food, drinking cups, or personal items that could cause the infection to spread to other people.  If swallowing is difficult, try eating soft foods until your sore throat feels better.  Drink enough fluid to keep your urine clear or pale yellow. General Instructions  Gargle with a salt-water mixture 3-4 times per day or as needed. To make a salt-water mixture, completely dissolve -1 tsp of salt in 1 cup of warm water.  Make sure that all household members wash their hands well.  Get plenty of rest.  Stay home from school or work until you have been taking antibiotics for 24 hours.  Keep all follow-up visits as told by your health care provider. This is important. SEEK MEDICAL  CARE IF:  The glands in your neck continue to get bigger.  You develop a rash, cough, or earache.  You cough up a thick liquid that is green, yellow-brown, or bloody.  You have pain or discomfort that does not get better with medicine.  Your problems seem to be getting worse rather than better.  You have a fever. SEEK IMMEDIATE MEDICAL CARE IF:  You have new symptoms, such as vomiting, severe headache, stiff or painful neck, chest  pain, or shortness of breath.  You have severe throat pain, drooling, or changes in your voice.  You have swelling of the neck, or the skin on the neck becomes red and tender.  You have signs of dehydration, such as fatigue, dry mouth, and decreased urination.  You become increasingly sleepy, or you cannot wake up completely.  Your joints become red or painful.   This information is not intended to replace advice given to you by your health care provider. Make sure you discuss any questions you have with your health care provider.   Document Released: 02/01/2000 Document Revised: 10/25/2014 Document Reviewed: 05/29/2014 Elsevier Interactive Patient Education 2016 [employer]. [employer] Assistance The United Ways 211 is a great source of information about community services available.  Access by dialing 2-1-1 from anywhere in [place], or by website -  PooledIncome.pl.   Other Local Resources (Updated 02/2015)  Financial Assistance   Services    Phone Number and Address  [facility]  Low-cost medical care - 1st and 3rd Saturday of every month  Must not qualify for public or private insurance and must have limited income [phone] [address] [place], [place]    [place] [employer] of Social Services  Child care  Emergency assistance for housing and [employer]  Medicaid [id number] [address] [place], [place] [address]   [facility] Department  Low-cost medical care for children, communicable diseases, sexually-transmitted diseases, immunizations, maternity care, womens health and family planning [phone] [address] [place], [place] [address]  [facility]   Medication assistance for [facility] residents  Must meet income requirements [phone] [address] [place], [place].    [facility] Social Services  Child care  Emergency assistance for housing and [employer]  Medicaid [id number] [address] [place], [place] [address]  [facility]   Low-cost medical care,   Monday through Friday, 9 am to 6 pm.   Accepts Medicare/Medicaid, and self-pay [phone] [address] [place], [place] [address]  [facility]  Low-cost medical care - Monday through Friday, 8:30 am - 5:30 pm  Accepts Medicaid and self-pay [phone] [address] [place], [place] [address]   [facility]  Primary medical care, including for those with sickle cell disease  Accepts Medicare, Medicaid, insurance and self-pay [phone] [address] [place], [place]  [facility]   Primary medical care  Accepts Medicare, [place], insurance and self-pay [phone] [address] [place], [place] [address]   [facility] Department of Social Services  Child care  Emergency assistance for housing and [employer]  Medicaid [phone] [address] [place], [place] [address]  [facility] Department of Health and [employer]  Child care  Emergency assistance for housing and [employer]  Medicaid [phone] [address] [place],  [place] [id number]   [facility]ication [facility]  Medication assistance for [facility] residents with no insurance only  Must have a primary care doctor [phone] [address] [place], [place]  [facility]   Primary medical care  [place] Medicare, [place], insurance  [phone] [address] [place], [place]  [facility]   Medication assistance [phone]  [facility]   Primary medical care  Accepts Medicare, [place], insurance and self-pay [phone] [address]  [place], [place] [address]  [facility]   Primary medical care  Accepts Medicare, [place], insurance and self-pay [phone] [address] [place], [place] [address]  [facility]  For [place] residents between the ages of 6418 and 3264 who do not have any form of health insurance, Medicare, [place], or [place] benefits.  Services are provided free of charge to uninsured patients who fall within federal poverty guidelines.    Hours: Tuesdays and Thursdays, 4:15 - 8 pm [phone] [address], [address] [place], [place] [address]  [facility]     Primary medical care  Dental care  Nutritional counseling  Pharmacy  Accepts Medicaid, Medicare, most insurance.  Fees are adjusted based on ability to pay.   [phone] [facility] [address] [place], [place]  [phone] [address] [facility] [address] [place], [place]  [phone] [facility] [place], [place]  [phone] [facility], [address] [place], [place]  [phone] [facility] [address] [place], [place]  [facility]  Womens health and family planning [phone] [address] [place], [place]  [facility] Department of Social Services  Child care  Emergency assistance for housing and Kimberly[employer]Clarkutilities  Food stamps  Medicaid [id number] [address], [place], [place] [address]   [facility]    Ages 5718 and older  Hours: Mondays and Thursdays, 7:00 am - 9:00 am Patients are seen on a first come, first served basis. [phone] [address] [place], [place]  [facility] of Social Services  Child care  Emergency assistance for housing and Kimberly[employer]Clarkutilities  Food stamps  Medicaid [phone] [address] [place], [place] [phone]  [employer]  Medication assistance  Rental  assistance  Food pantry  Medication assistance  Housing assistance  Emergency food distribution  Utility assistance [phone] [place], [place]  [phone]  [address] [place], [place] [address] Hours: Tuesdays and Thursdays from 9am - 12 noon by appointment only  [phone] [address] [place], [place] [address]  [facility] - Lanae Boastlara F. Gunn   Accepts private insurance, [place]are, and [place]icaid.  Payment is based on a sliding scale for those without insurance.  Hours: Mondays, Tuesdays and Thursdays, 8:30 am - 5:30 pm.   [phone] [address] [place], [place]  [facility] - Family Medicine at [facility]    Accepts private insurance, [place]are, and [place]icaid.  Payment is based on a sliding scale for those without insurance. [phone] [address] [place], [place]  [facility] - Pediatrics at E. Scientist, research (physical sciences)[place]  Accepts private insurance, [employer] EntertainmentMedicare, and [place]icaid.  Payment is based on a sliding scale for those without insurance [phone] [address], [place], [place]  [facility] - Pediatrics at [place]  Accepts private insurance, [place]Medicare, and [place]icaid.  Payment is based on a sliding scale for those without insurance. [phone] [address] [place], [place]  [facility] - Pediatrics at [place], [place], and [place].  Payment is based on a sliding scale for those without insurance. [phone], [phone] [place], [place].    [facility] Out[facility]  Maternity care.  Accepts Medicaid and self-pay. [phone] [address] [place], [place]

## 2015-06-15 NOTE — ED Notes (Signed)
Pt reports sore throat and fever x 2 days

## 2015-06-26 ENCOUNTER — Emergency Department (HOSPITAL_COMMUNITY)
Admission: EM | Admit: 2015-06-26 | Discharge: 2015-06-26 | Disposition: A | Discharge status: Home / Self Care | Payer: Self-pay | Attending: Emergency Medicine | Admitting: Emergency Medicine

## 2015-06-26 ENCOUNTER — Emergency Department (HOSPITAL_COMMUNITY): Payer: Self-pay

## 2015-06-26 DIAGNOSIS — Z792 Long term (current) use of antibiotics: Secondary | ICD-10-CM | POA: Insufficient documentation

## 2015-06-26 DIAGNOSIS — Z79899 Other long term (current) drug therapy: Secondary | ICD-10-CM | POA: Insufficient documentation

## 2015-06-26 DIAGNOSIS — Z8639 Personal history of other endocrine, nutritional and metabolic disease: Secondary | ICD-10-CM | POA: Insufficient documentation

## 2015-06-26 DIAGNOSIS — F17200 Nicotine dependence, unspecified, uncomplicated: Secondary | ICD-10-CM | POA: Insufficient documentation

## 2015-06-26 DIAGNOSIS — R079 Chest pain, unspecified: Secondary | ICD-10-CM | POA: Insufficient documentation

## 2015-06-26 LAB — CBC
HCT: 37 % (ref 36.0–46.0)
HEMOGLOBIN: 11.8 g/dL — AB (ref 12.0–15.0)
MCH: 30.2 pg (ref 26.0–34.0)
MCHC: 31.9 g/dL (ref 30.0–36.0)
MCV: 94.6 fL (ref 78.0–100.0)
Platelets: 314 10*3/uL (ref 150–400)
RBC: 3.91 MIL/uL (ref 3.87–5.11)
RDW: 15.4 % (ref 11.5–15.5)
WBC: 9.1 10*3/uL (ref 4.0–10.5)

## 2015-06-26 LAB — COMPREHENSIVE METABOLIC PANEL
ALBUMIN: 3 g/dL — AB (ref 3.5–5.0)
ALK PHOS: 49 U/L (ref 38–126)
ALT: 20 U/L (ref 14–54)
ANION GAP: 9 (ref 5–15)
AST: 22 U/L (ref 15–41)
BUN: 10 mg/dL (ref 6–20)
CHLORIDE: 109 mmol/L (ref 101–111)
CO2: 21 mmol/L — AB (ref 22–32)
Calcium: 8.2 mg/dL — ABNORMAL LOW (ref 8.9–10.3)
Creatinine, Ser: 0.9 mg/dL (ref 0.44–1.00)
GFR calc non Af Amer: >60 mL/min (ref >60)
GLUCOSE: 80 mg/dL (ref 65–99)
POTASSIUM: 4.3 mmol/L (ref 3.5–5.1)
SODIUM: 139 mmol/L (ref 135–145)
Total Bilirubin: 0.4 mg/dL (ref 0.3–1.2)
Total Protein: 5.5 g/dL — ABNORMAL LOW (ref 6.5–8.1)

## 2015-06-26 LAB — I-STAT TROPONIN, ED
Troponin i, poc: 0 ng/mL (ref 0.00–0.08)
Troponin i, poc: 0 ng/mL (ref 0.00–0.08)

## 2015-06-26 MED ORDER — IBUPROFEN 600 MG PO TABS: 600.0000 mg | ORAL_TABLET | Freq: Three times a day (TID) | ORAL | Status: DC

## 2015-06-26 MED ORDER — KETOROLAC TROMETHAMINE 30 MG/ML IJ SOLN
60.0000 mg | Freq: Once | INTRAMUSCULAR | Status: AC
  Administered 2015-06-26: 60 mg via INTRAMUSCULAR
  Filled 2015-06-26: qty 2

## 2015-06-26 NOTE — ED Notes (Signed)
Pt states she understands instructions, refuses w/c. Home ambulatory with steady gait.

## 2015-06-26 NOTE — ED Provider Notes (Signed)
CSN: [id number]     Arrival date & time 06/26/15  4098 History   First MD Initiated Contact with Patient 06/26/15 [id number]     Chief Complaint  Patient presents with  . Chest Pain     (Consider location/radiation/quality/duration/timing/severity/associated sxs/prior Treatment) HPI Patient presents to the [facility] ED for evaluation of chest pain. Patient has an unremarkable PMH. She states chest pain started about three days ago. Chest pain is sharp, localized to the left chest and does not radiate. Pain is worse with certain body positions, including sitting up, and she does notice it when she is active, but attributes the pain to moving her torso a certain way. She reports no associated dyspnea, palpitations, abdominal pain, lightheadedness. She has a recent history of strep throat and is on amoxicillin for therapy.   Past Medical History  Diagnosis Date  . Vitamin D deficiency disease    Past Surgical History  Procedure Laterality Date  . Cesarean section     No family history on file. Social History  Substance Use Topics  . Smoking status: Current Every Day Smoker  . Smokeless tobacco: Not on file  . Alcohol Use: Yes   OB History    No data available     Review of Systems  Respiratory: Negative for cough, chest tightness, shortness of breath and wheezing.   Cardiovascular: Positive for chest pain. Negative for palpitations.  Gastrointestinal: Negative for nausea, vomiting, diarrhea and constipation.  Neurological: Negative for dizziness, syncope, weakness and light-headedness.  All other systems reviewed and are negative.     Allergies  Review of patient's allergies indicates no known allergies.  Home Medications   Prior to Admission medications   Medication Sig Start Date End Date Taking? Authorizing Provider  amoxicillin (AMOXIL) 500 MG capsule Take 1 capsule (500 mg total) by mouth 3 (three) times daily. 06/15/15  Yes Nicole Pisciotta, PA-C  acetaminophen-codeine  120-12 MG/5ML solution Take 10 mLs by mouth every 4 (four) hours as needed for moderate pain. 06/07/14   Christopher Lawyer, PA-C  cephALEXin (KEFLEX) 500 MG capsule Take 1 capsule (500 mg total) by mouth 4 (four) times daily. 07/13/14   Ladona Mow, PA-C  Guaifenesin 1200 MG TB12 Take 1 tablet (1,200 mg total) by mouth 2 (two) times daily. 06/07/14   Charlestine Night, PA-C  ibuprofen (ADVIL,MOTRIN) 600 MG tablet Take 1 tablet (600 mg total) by mouth 3 (three) times daily. Take with food 06/26/15   Narda Bonds, MD   BP 127/69 mmHg  Pulse 72  Temp(Src) 98.2 F (36.8 C) (Oral)  Resp 21  SpO2 100%  LMP 05/19/2015 Physical Exam  Constitutional: She is oriented to person, place, and time. She appears well-developed and well-nourished. No distress.  HENT:  Head: Normocephalic and atraumatic.  Eyes: Conjunctivae are normal. Pupils are equal, round, and reactive to light.  Neck: Normal range of motion. Neck supple. No thyromegaly present.  Cardiovascular: Normal rate, regular rhythm and normal heart sounds.  Exam reveals no friction rub.   No murmur heard. Pulmonary/Chest: Effort normal and breath sounds normal. No respiratory distress. She has no wheezes. She has no rales.  Abdominal: Soft. Bowel sounds are normal. She exhibits no distension. There is no tenderness. There is no rebound and no guarding.  Musculoskeletal: Normal range of motion. She exhibits no edema or tenderness.  Lymphadenopathy:    She has no cervical adenopathy.  Neurological: She is alert and oriented to person, place, and time.  Skin: Skin is warm  and dry.    ED Course  Procedures (including critical care time) Labs Review Labs Reviewed  COMPREHENSIVE METABOLIC PANEL - Abnormal; Notable for the following:    CO2 21 (*)    Calcium 8.2 (*)    Total Protein 5.5 (*)    Albumin 3.0 (*)    All other components within normal limits  CBC - Abnormal; Notable for the following:    Hemoglobin 11.8 (*)    All other  components within normal limits  I-STAT TROPOININ, ED  I-STAT TROPOININ, ED  Rosezena SensorI-STAT TROPOININ, ED    Imaging Review Dg Chest 2 View  06/26/2015  CLINICAL DATA:  Left-sided chest pain deep to the breast for the past 3 days; current smoker. EXAM: CHEST  2 VIEW COMPARISON:  PA and lateral chest x-ray of June 07, 2014 FINDINGS: The lungs are adequately inflated. The interstitial markings are mildly increased bilaterally. The suprahilar regions exhibit increased density bilaterally. There is no alveolar pneumonia. No parenchymal masses are observed. The heart and pulmonary vascularity are normal. The mediastinum is normal in width. There is no pleural effusion. The bony thorax is unremarkable. IMPRESSION: Subsegmental atelectasis in the suprahilar regions especially on the which may reflect acute bronchitis. There is no alveolar pneumonia. If the patient's chest discomfort persists, CT scanning would be a useful next imaging step. Electronically Signed   By: David  SwazilandJordan M.D.   On: 06/26/2015 09:11   I have personally reviewed and evaluated these images and lab results as part of my medical decision-making.   EKG Interpretation   Date/Time:  Tuesday Jun 26 2015 08:24:10 EDT Ventricular Rate:  69 PR Interval:  140 QRS Duration: 80 QT Interval:  390 QTC Calculation: 417 R Axis:   78 Text Interpretation:  Normal sinus rhythm Nonspecific T wave abnormality  Abnormal ECG PR depression in multiple leads Confirmed by [facility], Barbara CowerJASON  [phone]) on 06/26/2015 8:46:21 AM Also confirmed by [facility], Barbara CowerJASON [phone]),  editor BREWER, CCT, GLENN ([id number])  on 06/26/2015 11:49:56 AM      MDM   Final diagnoses:  Chest pain, unspecified chest pain type   Patient's symptoms consistent with pericarditis vs costochondritis. She is currently s/p strep throat infection on antibiotics. PERC negative. Troponin negative x2. EKG significant for PR depression. Patient improved with Toradol. Discharged home with scheduled  NSAID treatment. Discussed management with patient who agrees with plan. Follow-up with PCP as needed. Return precautions discussed. Stable for discharge home.  Narda Bondsalph A Nettey, MD 06/26/15 1612  Marily MemosJason Mesner, MD 06/27/15 1012

## 2015-06-26 NOTE — Discharge Instructions (Signed)
We worked up your chest pain. Your symptoms and EKG suggest you could have pericarditis but your pain could also be caused by inflammation of the muscles and bones of your ribs. We treat these similarily. Please take ibuprofen 600mg  three times per day scheduled with a meal for the next 7 days. If symptoms fail to improve or worsen, please return for reevaluation.

## 2015-06-26 NOTE — ED Notes (Addendum)
Cp left side under breast started 2-3 days ago   Denies cough but  hurts to breath tho , no n/v states donates plasma twice a week x  6-7 months .

## 2015-06-26 NOTE — ED Provider Notes (Signed)
I saw and evaluated the patient, reviewed the resident's note and I agree with the findings and plan.  Chest pain in peristernal/left side of chest. Recent illness. Negative troponins. Doubt PE. ecg and story c/w possible pericarditis v costochondritis. Will treat appropriately.    EKG Interpretation   Date/Time:  Tuesday Jun 26 2015 08:24:10 EDT Ventricular Rate:  69 PR Interval:  140 QRS Duration: 80 QT Interval:  390 QTC Calculation: 417 R Axis:   78 Text Interpretation:  Normal sinus rhythm Nonspecific T wave abnormality  Abnormal ECG PR depression in multiple leads Confirmed by [facility], Barbara CowerJASON  [phone]([id number]) on 06/26/2015 8:46:21 AM        Marily MemosJason Jayleigh Notarianni, MD 06/27/15 1014

## 2016-03-27 ENCOUNTER — Emergency Department (HOSPITAL_COMMUNITY): Payer: Medicaid Other

## 2016-03-27 ENCOUNTER — Other Ambulatory Visit: Payer: Self-pay

## 2016-03-27 ENCOUNTER — Encounter (HOSPITAL_COMMUNITY): Payer: Self-pay | Admitting: Emergency Medicine

## 2016-03-27 ENCOUNTER — Emergency Department (HOSPITAL_COMMUNITY)
Admission: EM | Admit: 2016-03-27 | Discharge: 2016-03-27 | Disposition: A | Discharge status: Home / Self Care | Payer: Medicaid Other | Attending: Emergency Medicine | Admitting: Emergency Medicine

## 2016-03-27 DIAGNOSIS — F17200 Nicotine dependence, unspecified, uncomplicated: Secondary | ICD-10-CM | POA: Insufficient documentation

## 2016-03-27 DIAGNOSIS — R079 Chest pain, unspecified: Secondary | ICD-10-CM

## 2016-03-27 DIAGNOSIS — R0789 Other chest pain: Secondary | ICD-10-CM | POA: Insufficient documentation

## 2016-03-27 LAB — CBC
HEMATOCRIT: 37.5 % (ref 36.0–46.0)
Hemoglobin: 12.1 g/dL (ref 12.0–15.0)
MCH: 30.3 pg (ref 26.0–34.0)
MCHC: 32.3 g/dL (ref 30.0–36.0)
MCV: 93.8 fL (ref 78.0–100.0)
Platelets: 331 10*3/uL (ref 150–400)
RBC: 4 MIL/uL (ref 3.87–5.11)
RDW: 16.1 % — ABNORMAL HIGH (ref 11.5–15.5)
WBC: 7.5 10*3/uL (ref 4.0–10.5)

## 2016-03-27 LAB — COMPREHENSIVE METABOLIC PANEL
ALBUMIN: 3.2 g/dL — AB (ref 3.5–5.0)
ALK PHOS: 53 U/L (ref 38–126)
ALT: 22 U/L (ref 14–54)
AST: 22 U/L (ref 15–41)
Anion gap: 10 (ref 5–15)
BUN: 6 mg/dL (ref 6–20)
CALCIUM: 8.6 mg/dL — AB (ref 8.9–10.3)
CO2: 25 mmol/L (ref 22–32)
CREATININE: 0.88 mg/dL (ref 0.44–1.00)
Chloride: 102 mmol/L (ref 101–111)
GFR calc Af Amer: >60 mL/min (ref >60)
GFR calc non Af Amer: >60 mL/min (ref >60)
GLUCOSE: 97 mg/dL (ref 65–99)
Potassium: 4.1 mmol/L (ref 3.5–5.1)
SODIUM: 137 mmol/L (ref 135–145)
Total Bilirubin: 0.1 mg/dL — ABNORMAL LOW (ref 0.3–1.2)
Total Protein: 5.7 g/dL — ABNORMAL LOW (ref 6.5–8.1)

## 2016-03-27 LAB — I-STAT BETA HCG BLOOD, ED (MC, WL, AP ONLY): I-stat hCG, quantitative: <5 m[IU]/mL (ref <5)

## 2016-03-27 LAB — I-STAT TROPONIN, ED: Troponin i, poc: 0 ng/mL (ref 0.00–0.08)

## 2016-03-27 MED ORDER — KETOROLAC TROMETHAMINE 30 MG/ML IJ SOLN
30.0000 mg | Freq: Once | INTRAMUSCULAR | Status: AC
  Administered 2016-03-27: 30 mg via INTRAMUSCULAR
  Filled 2016-03-27: qty 1

## 2016-03-27 MED ORDER — PANTOPRAZOLE SODIUM 20 MG PO TBEC: 20.0000 mg | DELAYED_RELEASE_TABLET | Freq: Every day | ORAL | 0 refills | Status: DC

## 2016-03-27 MED ORDER — GI COCKTAIL ~~LOC~~
30.0000 mL | Freq: Once | ORAL | Status: AC
  Administered 2016-03-27: 30 mL via ORAL
  Filled 2016-03-27: qty 30

## 2016-03-27 NOTE — ED Triage Notes (Signed)
Pt sts CP and nausea x 1 month

## 2016-03-27 NOTE — ED Notes (Signed)
Placed patient on the monitor into a gown waiting on provider 
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
## 2016-03-27 NOTE — Discharge Instructions (Signed)
Please read and follow all provided instructions.  Your diagnoses today include:  1. Chest pain, unspecified type     Tests performed today include: An EKG of your heart A chest x-ray Cardiac enzymes - a blood test for heart muscle damage Blood counts and electrolytes Vital signs. See below for your results today.   Medications prescribed:   Take any prescribed medications only as directed.  Follow-up instructions: Please follow-up with your primary care provider as soon as you can for further evaluation of your symptoms.   Return instructions:  SEEK IMMEDIATE MEDICAL ATTENTION IF: You have severe chest pain, especially if the pain is crushing or pressure-like and spreads to the arms, back, neck, or jaw, or if you have sweating, nausea (feeling sick to your stomach), or shortness of breath. THIS IS AN EMERGENCY. Don't wait to see if the pain will go away. Get medical help at once. Call 911 or 0 (operator). DO NOT drive yourself to the hospital.  Your chest pain gets worse and does not go away with rest.  You have an attack of chest pain lasting longer than usual, despite rest and treatment with the medications your caregiver has prescribed.  You wake from sleep with chest pain or shortness of breath. You feel dizzy or faint. You have chest pain not typical of your usual pain for which you originally saw your caregiver.  You have any other emergent concerns regarding your health.  Additional Information: Chest pain comes from many different causes. Your caregiver has diagnosed you as having chest pain that is not specific for one problem, but does not require admission.  You are at low risk for an acute heart condition or other serious illness.   Your vital signs today were: BP 142/80 (BP Location: Left Arm)    Pulse 63    Temp 98 F (36.7 C) (Oral)    Resp 20    LMP 03/16/2016 Comment: tubes tied for 8 years   SpO2 100%  If your blood pressure (BP) was elevated above 135/85 this  visit, please have this repeated by your doctor within one month. --------------

## 2016-07-29 ENCOUNTER — Ambulatory Visit (HOSPITAL_COMMUNITY)
Admission: EM | Admit: 2016-07-29 | Discharge: 2016-07-29 | Disposition: A | Discharge status: Home / Self Care | Payer: Medicaid Other | Attending: Internal Medicine | Admitting: Internal Medicine

## 2016-07-29 ENCOUNTER — Encounter (HOSPITAL_COMMUNITY): Payer: Self-pay | Admitting: Emergency Medicine

## 2016-07-29 DIAGNOSIS — M791 Myalgia: Secondary | ICD-10-CM

## 2016-07-29 DIAGNOSIS — M5489 Other dorsalgia: Secondary | ICD-10-CM

## 2016-07-29 DIAGNOSIS — M7918 Myalgia, other site: Secondary | ICD-10-CM

## 2016-07-29 MED ORDER — DICLOFENAC SODIUM 75 MG PO TBEC: 75.0000 mg | DELAYED_RELEASE_TABLET | Freq: Two times a day (BID) | ORAL | 0 refills | Status: DC

## 2016-07-29 MED ORDER — CYCLOBENZAPRINE HCL 10 MG PO TABS: 10.0000 mg | ORAL_TABLET | Freq: Two times a day (BID) | ORAL | 0 refills | Status: DC | PRN
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
## 2016-08-01 ENCOUNTER — Encounter (HOSPITAL_COMMUNITY): Payer: Self-pay | Admitting: Emergency Medicine

## 2016-08-01 ENCOUNTER — Emergency Department (HOSPITAL_COMMUNITY): Payer: No Typology Code available for payment source

## 2016-08-01 ENCOUNTER — Emergency Department (HOSPITAL_COMMUNITY)
Admission: EM | Admit: 2016-08-01 | Discharge: 2016-08-01 | Disposition: A | Discharge status: Home / Self Care | Payer: No Typology Code available for payment source | Attending: Emergency Medicine | Admitting: Emergency Medicine

## 2016-08-01 DIAGNOSIS — F17200 Nicotine dependence, unspecified, uncomplicated: Secondary | ICD-10-CM | POA: Diagnosis not present

## 2016-08-01 DIAGNOSIS — S8001XA Contusion of right knee, initial encounter: Secondary | ICD-10-CM | POA: Insufficient documentation

## 2016-08-01 DIAGNOSIS — M62830 Muscle spasm of back: Secondary | ICD-10-CM | POA: Diagnosis not present

## 2016-08-01 DIAGNOSIS — R51 Headache: Secondary | ICD-10-CM | POA: Diagnosis not present

## 2016-08-01 DIAGNOSIS — Y999 Unspecified external cause status: Secondary | ICD-10-CM | POA: Diagnosis not present

## 2016-08-01 DIAGNOSIS — Y92410 Unspecified street and highway as the place of occurrence of the external cause: Secondary | ICD-10-CM | POA: Diagnosis not present

## 2016-08-01 DIAGNOSIS — Y939 Activity, unspecified: Secondary | ICD-10-CM | POA: Diagnosis not present

## 2016-08-01 DIAGNOSIS — S8991XA Unspecified injury of right lower leg, initial encounter: Secondary | ICD-10-CM | POA: Diagnosis present

## 2016-08-01 MED ORDER — CYCLOBENZAPRINE HCL 5 MG PO TABS: 5.0000 mg | ORAL_TABLET | Freq: Three times a day (TID) | ORAL | 0 refills | Status: DC | PRN

## 2016-08-01 MED ORDER — DICLOFENAC SODIUM 50 MG PO TBEC: 50.0000 mg | DELAYED_RELEASE_TABLET | Freq: Two times a day (BID) | ORAL | 0 refills | Status: DC

## 2016-08-01 MED ORDER — IBUPROFEN 400 MG PO TABS
600.0000 mg | ORAL_TABLET | Freq: Once | ORAL | Status: AC
  Administered 2016-08-01: 600 mg via ORAL
  Filled 2016-08-01: qty 1
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
## 2016-08-01 NOTE — Progress Notes (Signed)
Orthopedic Tech Progress Note Patient Details:  Tiffany SizerKeandra A Weiss [birth year] [id number]  Ortho Devices Type of Ortho Device: Knee Sleeve Ortho Device/Splint Location: applied knee sleeve to pt right leg knee.  pt tolerated well.   Right knee Ortho Device/Splint Interventions: Application, Adjustment   Alvina ChouWilliams, Tiffany Weiss 08/01/2016, 5:58 PM
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
## 2016-08-01 NOTE — Discharge Instructions (Signed)
Wear the knee sleeve for comfort. Follow up with Dr. Carola FrostHandy if the pain continues. Do not drive while taking the muscle relaxant as it will make you sleepy.

## 2016-09-03 ENCOUNTER — Emergency Department (HOSPITAL_COMMUNITY)
Admission: EM | Admit: 2016-09-03 | Discharge: 2016-09-03 | Disposition: A | Discharge status: Home / Self Care | Payer: Medicaid Other | Attending: Emergency Medicine | Admitting: Emergency Medicine

## 2016-09-03 ENCOUNTER — Encounter (HOSPITAL_COMMUNITY): Payer: Self-pay | Admitting: Emergency Medicine

## 2016-09-03 DIAGNOSIS — H5711 Ocular pain, right eye: Secondary | ICD-10-CM | POA: Diagnosis present

## 2016-09-03 DIAGNOSIS — H02843 Edema of right eye, unspecified eyelid: Secondary | ICD-10-CM | POA: Insufficient documentation

## 2016-09-03 DIAGNOSIS — Z79899 Other long term (current) drug therapy: Secondary | ICD-10-CM | POA: Diagnosis not present

## 2016-09-03 DIAGNOSIS — H5789 Other specified disorders of eye and adnexa: Secondary | ICD-10-CM

## 2016-09-03 DIAGNOSIS — F17210 Nicotine dependence, cigarettes, uncomplicated: Secondary | ICD-10-CM | POA: Insufficient documentation

## 2016-09-03 MED ORDER — DIPHENHYDRAMINE HCL 25 MG PO CAPS
25.0000 mg | ORAL_CAPSULE | Freq: Once | ORAL | Status: DC
  Filled 2016-09-03: qty 1

## 2016-09-03 MED ORDER — DEXAMETHASONE 4 MG PO TABS
10.0000 mg | ORAL_TABLET | Freq: Once | ORAL | Status: AC
  Administered 2016-09-03: 10 mg via ORAL
  Filled 2016-09-03: qty 3

## 2016-09-03 MED ORDER — TETRACAINE HCL 0.5 % OP SOLN
1.0000 [drp] | Freq: Once | OPHTHALMIC | Status: AC
  Administered 2016-09-03: 1 [drp] via OPHTHALMIC
  Filled 2016-09-03: qty 4

## 2016-09-03 MED ORDER — CEPHALEXIN 500 MG PO CAPS: 500.0000 mg | ORAL_CAPSULE | Freq: Four times a day (QID) | ORAL | 0 refills | Status: DC

## 2016-09-03 MED ORDER — CEPHALEXIN 250 MG PO CAPS
500.0000 mg | ORAL_CAPSULE | Freq: Once | ORAL | Status: AC
  Administered 2016-09-03: 500 mg via ORAL
  Filled 2016-09-03: qty 2

## 2016-09-03 MED ORDER — FLUORESCEIN SODIUM 0.6 MG OP STRP
1.0000 | ORAL_STRIP | Freq: Once | OPHTHALMIC | Status: AC
  Administered 2016-09-03: 1 via OPHTHALMIC
  Filled 2016-09-03: qty 1

## 2016-09-03 NOTE — ED Triage Notes (Signed)
Pt c/o itching and pain to right after insect bite that occurred yesterday, denies vision changes.

## 2016-09-03 NOTE — ED Notes (Signed)
Woods lamp at bedside.  

## 2016-09-03 NOTE — ED Provider Notes (Signed)
[facility] Provider Note   CSN: [id number] Arrival date & time: 09/03/16  0545     History   Chief Complaint Chief Complaint  Patient presents with  . Eye Pain    HPI Tiffany Weiss is a 31 y.o. female.   Eye Pain  This is a new problem. The current episode started yesterday. The problem occurs constantly. The problem has been gradually worsening. Pertinent negatives include no chest pain and no headaches. Nothing aggravates the symptoms. Nothing relieves the symptoms. She has tried nothing for the symptoms.    Past Medical History:  Diagnosis Date  . Vitamin D deficiency disease     There are no active problems to display for this patient.   Past Surgical History:  Procedure Laterality Date  . CESAREAN SECTION    . TUBAL LIGATION      OB History    No data available       Home Medications    Prior to Admission medications   Medication Sig Start Date End Date Taking? Authorizing Provider  cephALEXin (KEFLEX) 500 MG capsule Take 1 capsule (500 mg total) by mouth 4 (four) times daily. 09/03/16   Cathryn Gallery, Barbara CowerJason, MD  cyclobenzaprine (FLEXERIL) 5 MG tablet Take 1 tablet (5 mg total) by mouth 3 (three) times daily as needed. 08/01/16   Janne NapoleonNeese, Hope M, NP  diclofenac (VOLTAREN) 50 MG EC tablet Take 1 tablet (50 mg total) by mouth 2 (two) times daily. 08/01/16   Janne NapoleonNeese, Hope M, NP    Family History No family history on file.  Social History Social History  Substance Use Topics  . Smoking status: Current Every Day Smoker  . Smokeless tobacco: Never Used  . Alcohol use Yes     Allergies   Patient has no known allergies.   Review of Systems Review of Systems  Eyes: Positive for pain.  Cardiovascular: Negative for chest pain.  Skin: Positive for rash.  Neurological: Negative for headaches.  All other systems reviewed and are negative.    Physical Exam Updated Vital Signs BP (!) 139/96   Pulse 63   Temp 98.7 F (37.1 C) (Oral)   Resp 16   Ht  5\' 2"  (1.575 m)   Wt 79.4 kg (175 lb)   LMP 08/17/2016   SpO2 100%   BMI 32.01 kg/m   Physical Exam  Constitutional: She is oriented to person, place, and time. She appears well-developed and well-nourished.  HENT:  Head: Normocephalic and atraumatic.  Eyes: Conjunctivae and EOM are normal.  Woods Lamp without area of focal uptake No pain with EOM. Blurry vision but intact. PERRL  Neck: Normal range of motion.  Cardiovascular: Normal rate and regular rhythm.   Pulmonary/Chest: Effort normal and breath sounds normal. No stridor. No respiratory distress.  Abdominal: Soft. She exhibits no distension.  Musculoskeletal: Normal range of motion. She exhibits no edema or deformity.  Neurological: She is alert and oriented to person, place, and time. No cranial nerve deficit.  Skin: Skin is warm and dry. Rash (and edema of lower>upper eyelid) noted. No erythema. No pallor.  Nursing note and vitals reviewed.    ED Treatments / Results  Labs (all labs ordered are listed, but only abnormal results are displayed) Labs Reviewed - No data to display  EKG  EKG Interpretation None       Radiology No results found.  Procedures Procedures (including critical care time)  Medications Ordered in ED Medications  tetracaine (PONTOCAINE) 0.5 % ophthalmic solution  1 drop (1 drop Right Eye Given by Other 09/03/16 0758)  fluorescein ophthalmic strip 1 strip (1 strip Right Eye Given by Other 09/03/16 0758)  cephALEXin (KEFLEX) capsule 500 mg (500 mg Oral Given 09/03/16 0847)  dexamethasone (DECADRON) tablet 10 mg (10 mg Oral Given 09/03/16 0848)     Initial Impression / Assessment and Plan / ED Course  I have reviewed the triage vital signs and the nursing notes.  Pertinent labs & imaging results that were available during my care of the patient were reviewed by me and considered in my medical decision making (see chart for details).     Suspect periorbital cellulitis versus localized  inflammation from an insect bite. We'll treat with antibiotics and inflammatory medications. No evidence of orbital cellulitis at this time. She has no pain with extraocular movements decreased vision or proptosis.  Final Clinical Impressions(s) / ED Diagnoses   Final diagnoses:  Eye swelling    New Prescriptions Discharge Medication List as of 09/03/2016  8:50 AM    START taking these medications   Details  cephALEXin (KEFLEX) 500 MG capsule Take 1 capsule (500 mg total) by mouth 4 (four) times daily., Starting Wed 09/03/2016, Print         Rosan Calbert, Barbara Cower, MD 09/03/16 [phone]

## 2016-10-22 ENCOUNTER — Encounter (HOSPITAL_COMMUNITY): Payer: Self-pay | Admitting: *Deleted

## 2016-10-22 ENCOUNTER — Emergency Department (HOSPITAL_COMMUNITY)
Admission: EM | Admit: 2016-10-22 | Discharge: 2016-10-22 | Disposition: A | Discharge status: Home / Self Care | Payer: Medicaid Other | Attending: Emergency Medicine | Admitting: Emergency Medicine

## 2016-10-22 DIAGNOSIS — R600 Localized edema: Secondary | ICD-10-CM | POA: Diagnosis present

## 2016-10-22 DIAGNOSIS — F17210 Nicotine dependence, cigarettes, uncomplicated: Secondary | ICD-10-CM | POA: Insufficient documentation

## 2016-10-22 DIAGNOSIS — H02843 Edema of right eye, unspecified eyelid: Secondary | ICD-10-CM | POA: Insufficient documentation

## 2016-10-22 DIAGNOSIS — Z79899 Other long term (current) drug therapy: Secondary | ICD-10-CM | POA: Insufficient documentation

## 2016-10-22 MED ORDER — PREDNISONE 20 MG PO TABS: 40.0000 mg | ORAL_TABLET | Freq: Every day | ORAL | 0 refills | Status: DC

## 2016-10-22 MED ORDER — PREDNISONE 20 MG PO TABS
40.0000 mg | ORAL_TABLET | Freq: Once | ORAL | Status: AC
  Administered 2016-10-22: 40 mg via ORAL
  Filled 2016-10-22: qty 2

## 2016-10-22 NOTE — ED Notes (Signed)
See EDP secondary assessment.  

## 2016-10-22 NOTE — ED Provider Notes (Signed)
[facility] Provider Note   CSN: [id number] Arrival date & time: 10/22/16  1103     History   Chief Complaint Chief Complaint  Patient presents with  . Facial Swelling    HPI Tiffany Weiss is a 31 y.o. female who presents with right eyelid swelling. She states that it started swelling about 2 days ago. She reports associated irritation and itching to the area. No vision changes, eye pain, or drainage from the area. She denies any new soaps detergents or lotions. She had a similar episode back in July and was treated with steroids and given a prescription for antibiotics which she did not take because of financial difficulties. The swelling got better on its own at that time.  HPI  Past Medical History:  Diagnosis Date  . Vitamin D deficiency disease     There are no active problems to display for this patient.   Past Surgical History:  Procedure Laterality Date  . CESAREAN SECTION    . TUBAL LIGATION      OB History    No data available       Home Medications    Prior to Admission medications   Medication Sig Start Date End Date Taking? Authorizing Provider  cephALEXin (KEFLEX) 500 MG capsule Take 1 capsule (500 mg total) by mouth 4 (four) times daily. 09/03/16   Mesner, Barbara CowerJason, MD  cyclobenzaprine (FLEXERIL) 5 MG tablet Take 1 tablet (5 mg total) by mouth 3 (three) times daily as needed. 08/01/16   Janne NapoleonNeese, Hope M, NP  diclofenac (VOLTAREN) 50 MG EC tablet Take 1 tablet (50 mg total) by mouth 2 (two) times daily. 08/01/16   Janne NapoleonNeese, Hope M, NP  predniSONE (DELTASONE) 20 MG tablet Take 2 tablets (40 mg total) by mouth daily. 10/22/16   Bethel BornGekas, Kelly Marie, PA-C    Family History No family history on file.  Social History Social History  Substance Use Topics  . Smoking status: Current Every Day Smoker    Packs/day: 0.40    Types: Cigarettes  . Smokeless tobacco: Never Used  . Alcohol use Yes     Allergies   Patient has no known allergies.   Review of  Systems Review of Systems  Constitutional: Negative for fever.  Eyes: Positive for discharge (watery) and itching (eyelid). Negative for pain and visual disturbance.  Skin: Negative for rash.     Physical Exam Updated Vital Signs BP (!) 141/76 (BP Location: Left Arm)   Pulse 83   Temp 98.5 F (36.9 C) (Oral)   Resp 16   Ht 5\' 3"  (1.6 m)   Wt 79.4 kg (175 lb)   LMP 10/11/2016   SpO2 96%   BMI 31.00 kg/m   Physical Exam  Constitutional: She is oriented to person, place, and time. She appears well-developed and well-nourished. No distress.  HENT:  Head: Normocephalic and atraumatic.  Eyes: Pupils are equal, round, and reactive to light. Conjunctivae and EOM are normal. Right eye exhibits no discharge. Left eye exhibits no discharge. No scleral icterus.  Moderate diffuse eyelid edema of upper right eyelid  Neck: Normal range of motion.  Cardiovascular: Normal rate.   Pulmonary/Chest: Effort normal. No respiratory distress.  Abdominal: She exhibits no distension.  Neurological: She is alert and oriented to person, place, and time.  Skin: Skin is warm and dry.  Psychiatric: She has a normal mood and affect. Her behavior is normal.  Nursing note and vitals reviewed.    ED Treatments / Results  Labs (all labs ordered are listed, but only abnormal results are displayed) Labs Reviewed - No data to display  EKG  EKG Interpretation None       Radiology No results found.  Procedures Procedures (including critical care time)  Medications Ordered in ED Medications  predniSONE (DELTASONE) tablet 40 mg (40 mg Oral Given 10/22/16 1325)     Initial Impression / Assessment and Plan / ED Course  I have reviewed the triage vital signs and the nursing notes.  Pertinent labs & imaging results that were available during my care of the patient were reviewed by me and considered in my medical decision making (see chart for details).  31 year old female with right eyelid  swelling for the past 2 days. This has happened before and responded to steroids. Does not appear infectious at this time. Does not appear consistent with a stye. Advised cool compresses and steroid burst for the next several days. Return precautions given.  Final Clinical Impressions(s) / ED Diagnoses   Final diagnoses:  Swelling of eyelid, right    New Prescriptions New Prescriptions   PREDNISONE (DELTASONE) 20 MG TABLET    Take 2 tablets (40 mg total) by mouth daily.     Bethel Born, PA-C 10/22/16 1401    Donnetta Hutching, MD 10/25/16 [phone]

## 2016-10-22 NOTE — Discharge Instructions (Signed)
Please take steroid for the next 5 days Return for worsening symptoms

## 2016-10-22 NOTE — ED Triage Notes (Signed)
Pt in c/o R eye swelling on R eye upper eyelid, pt reports being seen here the end of June for the same thing & did not take eye drops d/t not being able to afford eye drops, denies injury, small amt of clear eye drainage, A&O x4

## 2017-05-17 IMAGING — CR DG CHEST 2V
2 series · 2 of 2 positions shown · non-contrast
Comparison: PA and lateral chest x-ray June 07, 2014

CLINICAL DATA: Left-sided chest pain deep to the breast for the
past 3 days; current smoker.

EXAM:
CHEST  2 VIEW

[chest pa]
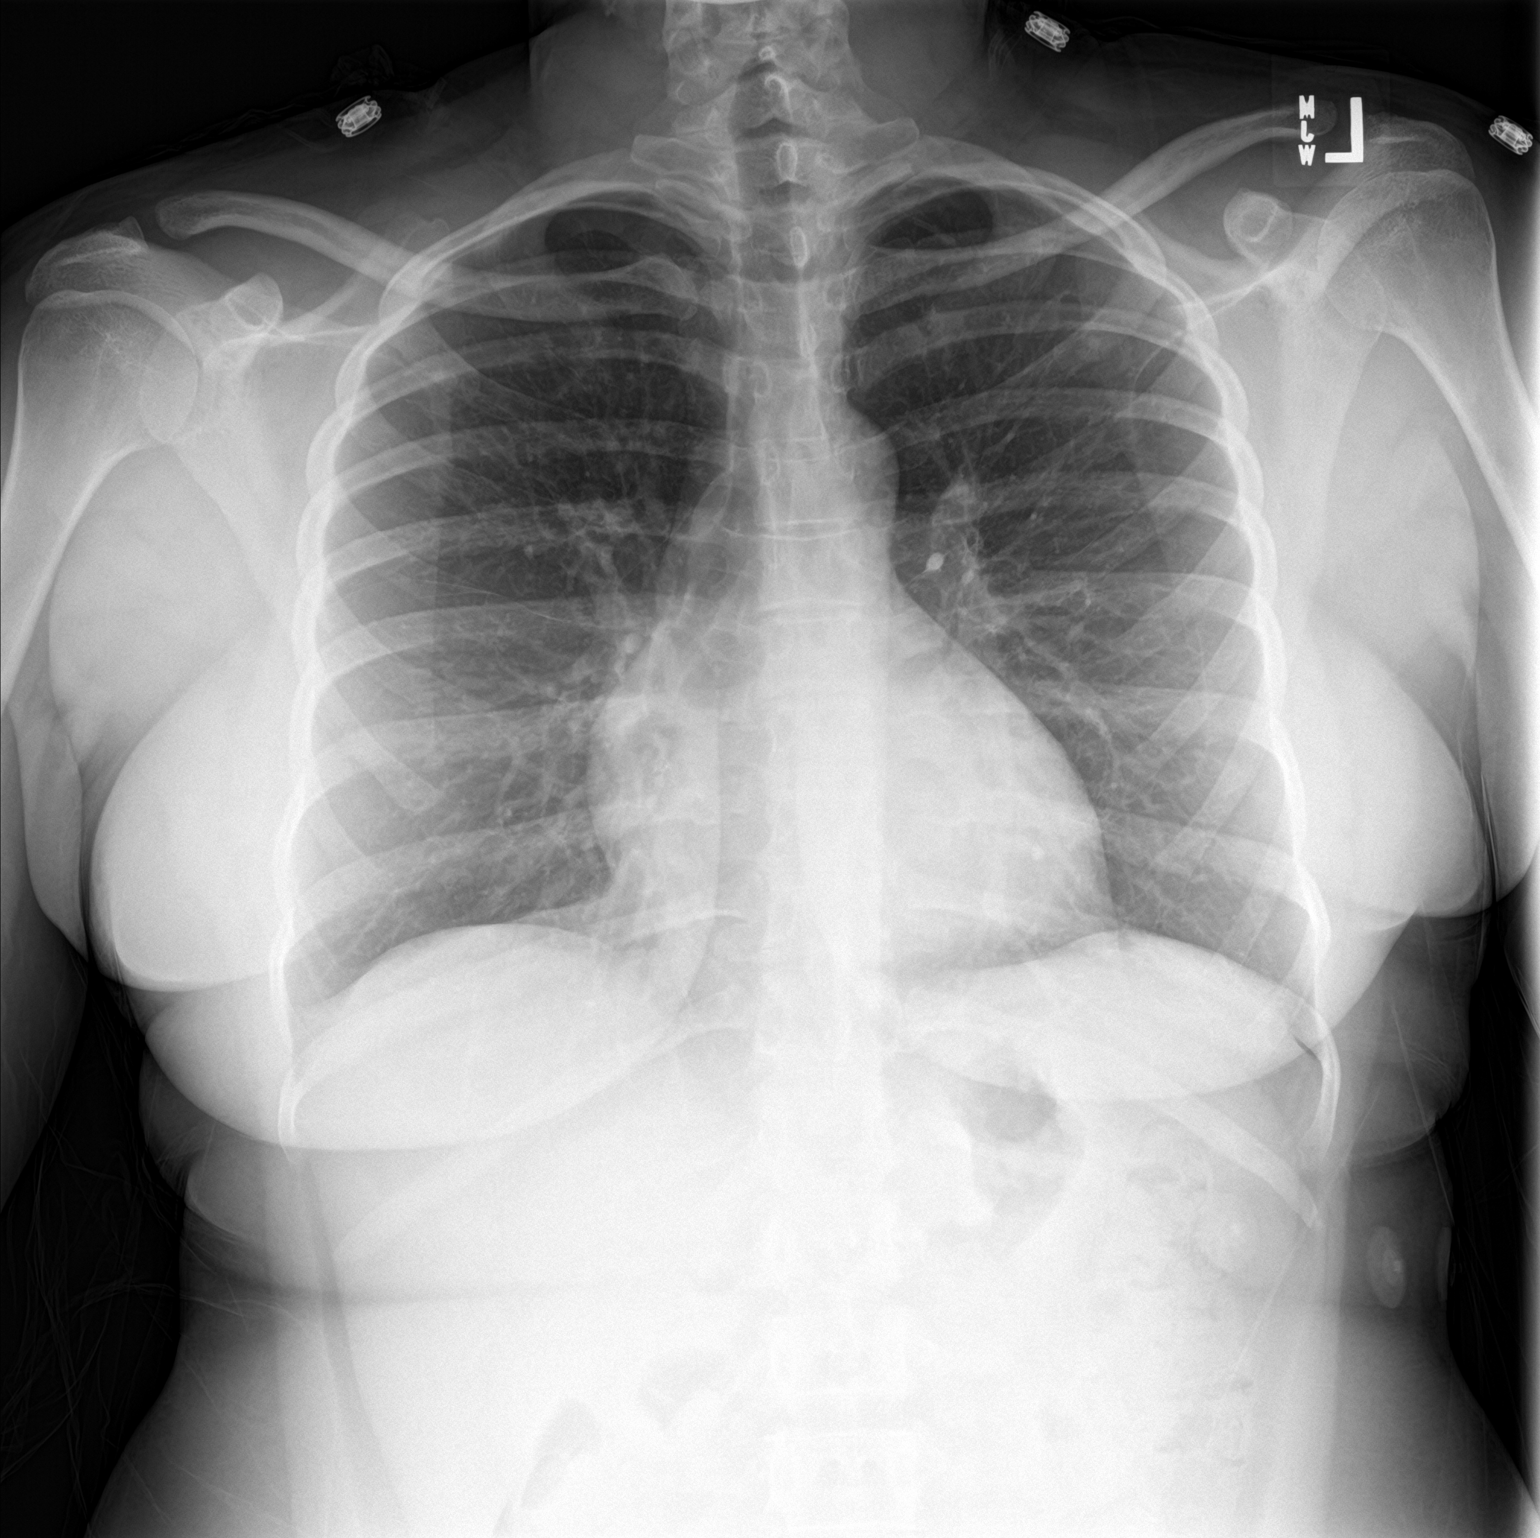

[chest lat]
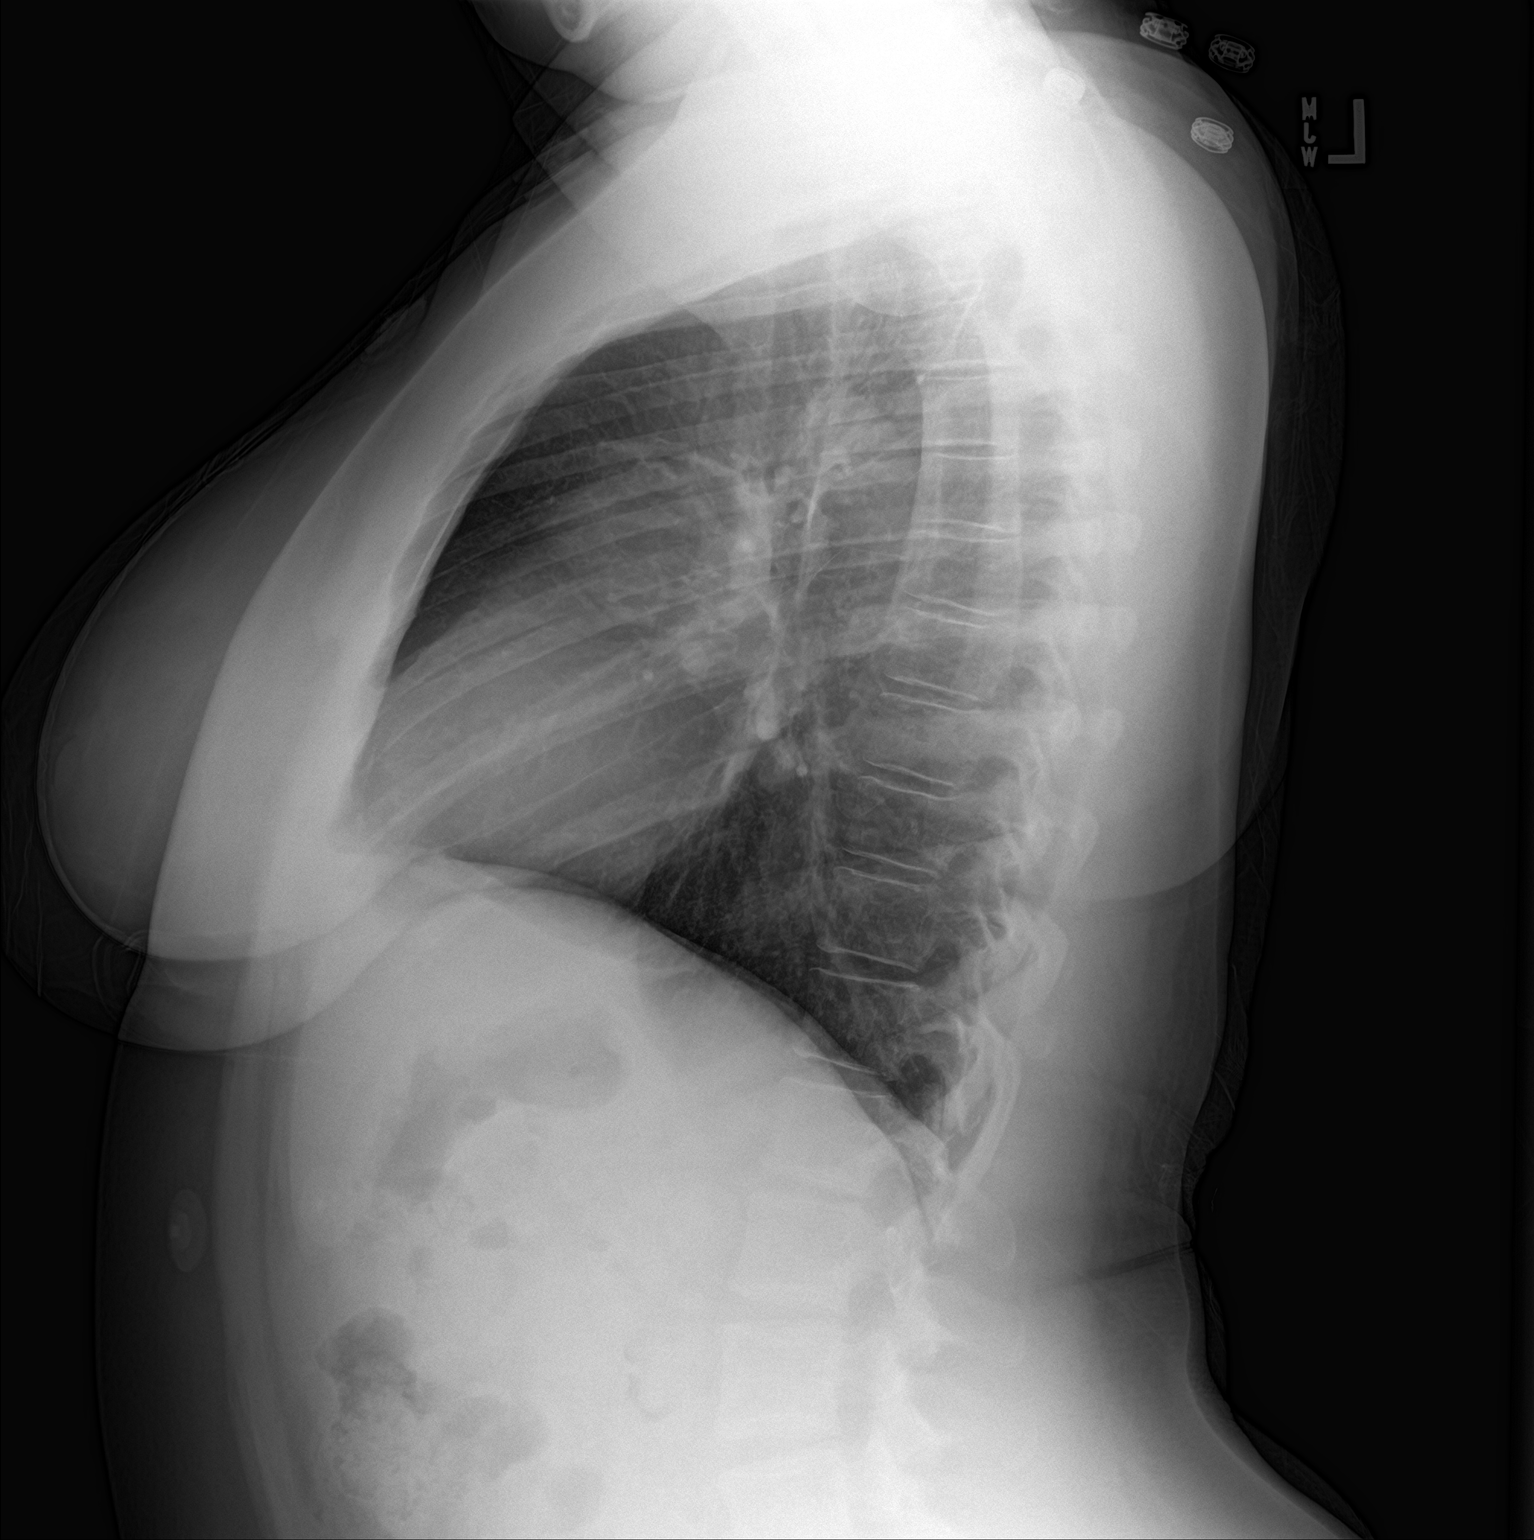

[2 of 2 positions shown; findings below may reference images not displayed]

FINDINGS: The lungs are adequately inflated. The interstitial markings are
mildly increased bilaterally. The suprahilar regions exhibit
increased density bilaterally. There is no alveolar pneumonia. No
parenchymal masses are observed. The heart and pulmonary vascularity
are normal. The mediastinum is normal in width. There is no pleural
effusion. The bony thorax is unremarkable.
IMPRESSION: Subsegmental atelectasis in the suprahilar regions especially on the
which may reflect acute bronchitis. There is no alveolar pneumonia.
If the patient's chest discomfort persists, CT scanning would be a
useful next imaging step.

## 2017-09-02 ENCOUNTER — Emergency Department (HOSPITAL_COMMUNITY)
Admission: EM | Admit: 2017-09-02 | Discharge: 2017-09-02 | Disposition: A | Discharge status: Home / Self Care | Payer: Self-pay | Attending: Emergency Medicine | Admitting: Emergency Medicine

## 2017-09-02 ENCOUNTER — Encounter (HOSPITAL_COMMUNITY): Payer: Self-pay

## 2017-09-02 ENCOUNTER — Other Ambulatory Visit: Payer: Self-pay

## 2017-09-02 DIAGNOSIS — N61 Mastitis without abscess: Secondary | ICD-10-CM

## 2017-09-02 DIAGNOSIS — S21039A Puncture wound without foreign body of unspecified breast, initial encounter: Secondary | ICD-10-CM

## 2017-09-02 DIAGNOSIS — F17210 Nicotine dependence, cigarettes, uncomplicated: Secondary | ICD-10-CM | POA: Insufficient documentation

## 2017-09-02 DIAGNOSIS — Z79899 Other long term (current) drug therapy: Secondary | ICD-10-CM | POA: Insufficient documentation

## 2017-09-02 DIAGNOSIS — N611 Abscess of the breast and nipple: Secondary | ICD-10-CM | POA: Insufficient documentation

## 2017-09-02 MED ORDER — CEPHALEXIN 500 MG PO CAPS: 500.0000 mg | ORAL_CAPSULE | Freq: Four times a day (QID) | ORAL | 0 refills | Status: DC

## 2017-09-02 MED ORDER — CEPHALEXIN 250 MG PO CAPS
500.0000 mg | ORAL_CAPSULE | Freq: Once | ORAL | Status: AC
  Administered 2017-09-02: 500 mg via ORAL
  Filled 2017-09-02: qty 2

## 2017-09-02 NOTE — ED Triage Notes (Signed)
Pt states that she got her nipples pierced in April, recently changed one week ago and since has been painful with green discharge. Denies fevers.

## 2017-09-02 NOTE — ED Provider Notes (Signed)
[facility]   CSN: [id number] Arrival date & time: 09/02/17  [id number]     History   Chief Complaint Chief Complaint  Patient presents with  . Breast Discharge    HPI Tiffany Weiss is a 32 y.o. female who presents to ED for evaluation of bilateral nipple discomfort/discharge for the past 3 to 4 days.  She got bilateral nipple piercings done in April 2019.  The barbell piercing that it was done with caused some irritation.  She then switched to the current hoops.  These have not bothered her until the past 3 to 4 days.  She has had multiple piercings on other parts of her body and has never had a reaction to any of the metal before.  She reports slight purulent drainage from the area.  She denies any fever, other trauma.  HPI  Past Medical History:  Diagnosis Date  . Vitamin D deficiency disease     There are no active problems to display for this patient.   Past Surgical History:  Procedure Laterality Date  . CESAREAN SECTION    . TUBAL LIGATION       OB History   None      Home Medications    Prior to Admission medications   Medication Sig Start Date End Date Taking? Authorizing Provider  cephALEXin (KEFLEX) 500 MG capsule Take 1 capsule (500 mg total) by mouth 4 (four) times daily. 09/02/17   Joely Losier, PA-C  cyclobenzaprine (FLEXERIL) 5 MG tablet Take 1 tablet (5 mg total) by mouth 3 (three) times daily as needed. 08/01/16   Janne NapoleonNeese, Hope M, NP  diclofenac (VOLTAREN) 50 MG EC tablet Take 1 tablet (50 mg total) by mouth 2 (two) times daily. 08/01/16   Janne NapoleonNeese, Hope M, NP  predniSONE (DELTASONE) 20 MG tablet Take 2 tablets (40 mg total) by mouth daily. 10/22/16   Bethel BornGekas, Kelly Marie, PA-C    Family History No family history on file.  Social History Social History   Tobacco Use  . Smoking status: Current Every Day Smoker    Packs/day: 0.40    Types: Cigarettes  . Smokeless tobacco: Never Used  Substance Use  Topics  . Alcohol use: Yes  . Drug use: No     Allergies   Patient has no known allergies.   Review of Systems Review of Systems  Constitutional: Negative for chills and fever.  Gastrointestinal: Negative for nausea and vomiting.  Skin: Positive for color change.     Physical Exam Updated Vital Signs BP (!) 154/113   Pulse (!) 58   Temp 98 F (36.7 C) (Oral)   Resp 14   SpO2 100%   Physical Exam  Constitutional: She appears well-developed and well-nourished. No distress.  HENT:  Head: Normocephalic and atraumatic.  Eyes: Conjunctivae and EOM are normal. No scleral icterus.  Neck: Normal range of motion.  Pulmonary/Chest: Effort normal. No respiratory distress.  Neurological: She is alert.  Skin: No rash noted. She is not diaphoretic.  Bilateral hoop nipple piercings noted.  There is a's very small amount of purulent drainage noted bilaterally.  No overlying erythema or tenderness to palpation noted.  Psychiatric: She has a normal mood and affect.  Nursing note and vitals reviewed.    ED Treatments / Results  Labs (all labs ordered are listed, but only abnormal results are displayed) Labs Reviewed - No data to display  EKG None  Radiology No results found.  Procedures  Procedures (including critical care time)  Medications Ordered in ED Medications  cephALEXin (KEFLEX) capsule 500 mg (500 mg Oral Given 09/02/17 2220)     Initial Impression / Assessment and Plan / ED Course  I have reviewed the triage vital signs and the nursing notes.  Pertinent labs & imaging results that were available during my care of the patient were reviewed by me and considered in my medical decision making (see chart for details).     32 year old female presents to ED for evaluation of 3 to 4-day history of bilateral nipple discharge and discomfort.  She had bilateral nipple piercings done in April 2019.  These have not given her an issue until recently.  She has had the hoops  in place since April.  She had multiple piercing onset on her body with no issues.  Reports some drainage from the area.  Denies any fever.  On physical exam there is no overlying erythema or tenderness to palpation noted.  She is afebrile.  I informed patient that we can cover her for possible infection but she will ultimately need to remove the nipple piercings to help the area heal and to prevent worsening of her infection.  She states that she will do so soon as possible.  Given first dose of antibiotics here.  Advised to return to ED for any severe worsening symptoms.  Portions of this note were generated with Scientist, clinical (histocompatibility and immunogenetics). Dictation errors may occur despite best attempts at proofreading.   Final Clinical Impressions(s) / ED Diagnoses   Final diagnoses:  Infected pierced nipple    ED Discharge Orders        Ordered    cephALEXin (KEFLEX) 500 MG capsule  4 times daily     09/02/17 2223       Dietrich Pates, PA-C 09/02/17 2224    Vanetta Mulders, MD 09/03/17 [phone]

## 2017-09-02 NOTE — Discharge Instructions (Addendum)
Please remove your piercings as soon as possible to prevent worsening of your infection. Return to ED for worsening symptoms, severe pain or redness of the area, fever, chest pain.

## 2018-01-18 ENCOUNTER — Encounter (HOSPITAL_COMMUNITY): Payer: Self-pay

## 2018-01-18 ENCOUNTER — Ambulatory Visit (INDEPENDENT_AMBULATORY_CARE_PROVIDER_SITE_OTHER): Payer: Self-pay

## 2018-01-18 ENCOUNTER — Other Ambulatory Visit: Payer: Self-pay

## 2018-01-18 ENCOUNTER — Ambulatory Visit (HOSPITAL_COMMUNITY)
Admission: EM | Admit: 2018-01-18 | Discharge: 2018-01-18 | Disposition: A | Discharge status: Home / Self Care | Payer: Self-pay | Attending: Internal Medicine | Admitting: Internal Medicine

## 2018-01-18 DIAGNOSIS — R1010 Upper abdominal pain, unspecified: Secondary | ICD-10-CM

## 2018-01-18 DIAGNOSIS — R1110 Vomiting, unspecified: Secondary | ICD-10-CM

## 2018-01-18 MED ORDER — ONDANSETRON 4 MG PO TBDP
ORAL_TABLET | ORAL | Status: AC
  Filled 2018-01-18: qty 1

## 2018-01-18 MED ORDER — ONDANSETRON 4 MG PO TBDP
4.0000 mg | ORAL_TABLET | Freq: Once | ORAL | Status: AC
  Administered 2018-01-18: 4 mg via ORAL

## 2018-01-18 MED ORDER — ONDANSETRON HCL 4 MG PO TABS: 4.0000 mg | ORAL_TABLET | Freq: Four times a day (QID) | ORAL | 0 refills | Status: DC

## 2018-01-18 NOTE — ED Provider Notes (Signed)
[facility]    CSN: [id number] Arrival date & time: 01/18/18  4235     History   Chief Complaint Chief Complaint  Patient presents with  . Abdominal Pain  . Emesis    HPI Tiffany Weiss is a 32 y.o. female.   Who presents with with upper abdominal cramping, nausea and vomiting since the day after eating Thanks Giving left over [place], Mac & cheese and green beans, and her daughter ate this as well, but she is fine. She did not eat the day of Thanks Giving and also states she has not been eating meat due to watching her diet, but had [place] the day after. She made [place] salad yesterday and ate 2 TSBP, but did not cause her to have abdominal pain. On average she has vomited x 4 each day. She denies having any diarrhea or constipation.  She thought it was due to menstrual flow, but never cramps with this.      Past Medical History:  Diagnosis Date  . Vitamin D deficiency disease     There are no active problems to display for this patient.   Past Surgical History:  Procedure Laterality Date  . CESAREAN SECTION    . TUBAL LIGATION      OB History   None      Home Medications    Prior to Admission medications   Medication Sig Start Date End Date Taking? Authorizing Provider  cephALEXin (KEFLEX) 500 MG capsule Take 1 capsule (500 mg total) by mouth 4 (four) times daily. 09/02/17   Khatri, Hina, PA-C  cyclobenzaprine (FLEXERIL) 5 MG tablet Take 1 tablet (5 mg total) by mouth 3 (three) times daily as needed. 08/01/16   Ashley Murrain, NP  diclofenac (VOLTAREN) 50 MG EC tablet Take 1 tablet (50 mg total) by mouth 2 (two) times daily. 08/01/16   Ashley Murrain, NP  ondansetron (ZOFRAN) 4 MG tablet Take 1 tablet (4 mg total) by mouth every 6 (six) hours. 01/18/18   Rodriguez-Southworth, Sunday Spillers, PA-C  predniSONE (DELTASONE) 20 MG tablet Take 2 tablets (40 mg total) by mouth daily. 10/22/16   Recardo Evangelist, PA-C    Family History Family History  Problem  Relation Age of Onset  . Heart failure Mother   . Diabetes Mother   . Hypertension Mother     Social History Social History   Tobacco Use  . Smoking status: Current Every Day Smoker    Packs/day: 0.40    Types: Cigarettes  . Smokeless tobacco: Never Used  Substance Use Topics  . Alcohol use: Yes  . Drug use: No     Allergies   Patient has no known allergies.   Review of Systems Review of Systems  Constitutional: Positive for appetite change, chills and diaphoresis. Negative for fever.  HENT: Negative.   Eyes: Negative for discharge.  Respiratory: Negative for cough and shortness of breath.   Cardiovascular: Negative for chest pain.       Had elevated BP this past Summer and was told to Fu with PCP, but she never did.   Gastrointestinal: Positive for abdominal pain, nausea and vomiting. Negative for abdominal distention, anal bleeding, blood in stool and constipation.  Genitourinary: Negative for dysuria, frequency and urgency.  Musculoskeletal: Negative for arthralgias.  Skin: Negative for rash.  Neurological: Negative for weakness.  Hematological: Negative for adenopathy.     Physical Exam Triage Vital Signs ED Triage Vitals  Enc Vitals Group  BP 01/18/18 0954 (!) 147/91     Pulse Rate 01/18/18 0954 88     Resp 01/18/18 0954 16     Temp 01/18/18 0954 98.2 F (36.8 C)     Temp Source 01/18/18 0954 Oral     SpO2 01/18/18 0954 100 %     Weight 01/18/18 0957 173 lb (78.5 kg)     Height --      Head Circumference --      Peak Flow --      Pain Score 01/18/18 0957 6     Pain Loc --      Pain Edu? --      Excl. in [place]? --    No data found.  Updated Vital Signs BP (!) 147/91 (BP Location: Left Arm)   Pulse 88   Temp 98.2 F (36.8 C) (Oral)   Resp 16   Wt 173 lb (78.5 kg)   LMP 01/16/2018   SpO2 100%   BMI 30.65 kg/m   Visual Acuity Right Eye Distance:   Left Eye Distance:   Bilateral Distance:    Right Eye Near:   Left Eye Near:      Bilateral Near:     Physical Exam  Constitutional: She appears well-developed and well-nourished.  Non-toxic appearance. She does not appear ill. No distress.  HENT:  Head: Normocephalic.  Eyes: No scleral icterus.  Cardiovascular: Normal rate and regular rhythm.  Pulmonary/Chest: Effort normal and breath sounds normal.  Abdominal: Soft. Normal appearance and bowel sounds are normal. She exhibits no distension, no pulsatile liver, no abdominal bruit, no ascites, no pulsatile midline mass and no mass. There is no hepatosplenomegaly. There is tenderness. There is no rigidity, no rebound and no guarding. No hernia.  Has mild tenderness above umbilicus, but not epigastric  Neurological: She is alert.  Skin: Skin is warm and dry. No rash noted.  Psychiatric: She has a normal mood and affect. Her behavior is normal.  Nursing note and vitals reviewed.  UC Treatments / Results  Labs (all labs ordered are listed, but only abnormal results are displayed) Labs Reviewed - No data to display  EKG None  Radiology Dg Abd 1 View  Result Date: 01/18/2018 CLINICAL DATA:  Abdominal pain with nausea and vomiting for the past 4 days. EXAM: ABDOMEN - 1 VIEW COMPARISON:  None. FINDINGS: The bowel gas pattern is normal. No radio-opaque calculi or other significant radiographic abnormality are seen. No acute osseous abnormality. IMPRESSION: Negative. Electronically Signed   By: Titus Dubin M.D.   On: 01/18/2018 10:27     Medications Ordered in UC Medications  ondansetron (ZOFRAN-ODT) disintegrating tablet 4 mg (4 mg Oral Given 01/18/18 1021)    Initial Impression / Assessment and Plan / UC Course  I have reviewed the triage vital signs and the nursing notes. I believe she has intolerance to [place]. Placed on BRAT diet.  She was given Zofran while in the room and this helped her. I gave her rx for this as well.  Work note given.  She was told to monitor her BP and FU with PCP Final Clinical  Impressions(s) / UC Diagnoses   Final diagnoses:  Non-intractable vomiting, presence of nausea not specified, unspecified vomiting type  Pain of upper abdomen     Discharge Instructions     I don't suspect you having food poisoning since normally diarrhea comes along with it and since your daughter also ate left overs, she would have the same symptoms.  It is possible that is due to you not being used to eating meat in a while. For now I want you to stay away from solid food. Just have some Organic Chicken broth, for 24 hours, then on the second day you may continue this but add crackers, toast, bananas, and other starches like rice and potatoes. Ones the nausea and abdominal cramps resolves, you may slowly advance to regular diet.   If you get worse, go to Emergency room.     ED Prescriptions    Medication Sig Dispense Auth. Provider   ondansetron (ZOFRAN) 4 MG tablet Take 1 tablet (4 mg total) by mouth every 6 (six) hours. 12 tablet Rodriguez-Southworth, Sunday Spillers, PA-C     Controlled Substance Prescriptions [place] Controlled Substance Registry consulted?    Shelby Mattocks, [place] 01/18/18 1649

## 2018-01-18 NOTE — Discharge Instructions (Signed)
I don't suspect you having food poisoning since normally diarrhea comes along with it and since your daughter also ate left overs, she would have the same symptoms.  It is possible that is due to you not being used to eating meat in a while. For now I want you to stay away from solid food. Just have some Organic Chicken broth, for 24 hours, then on the second day you may continue this but add crackers, toast, bananas, and other starches like rice and potatoes. Ones the nausea and abdominal cramps resolves, you may slowly advance to regular diet.   If you get worse, go to Emergency room.

## 2018-01-18 NOTE — ED Triage Notes (Signed)
Pt cc stomach pain and nausea , vomiting x 4 days.

## 2018-04-25 ENCOUNTER — Emergency Department (HOSPITAL_COMMUNITY)
Admission: EM | Admit: 2018-04-25 | Discharge: 2018-04-25 | Disposition: A | Discharge status: Home / Self Care | Payer: Medicaid Other | Attending: Emergency Medicine | Admitting: Emergency Medicine

## 2018-04-25 ENCOUNTER — Encounter (HOSPITAL_COMMUNITY): Payer: Self-pay

## 2018-04-25 ENCOUNTER — Other Ambulatory Visit: Payer: Self-pay

## 2018-04-25 DIAGNOSIS — Z79899 Other long term (current) drug therapy: Secondary | ICD-10-CM | POA: Insufficient documentation

## 2018-04-25 DIAGNOSIS — F17210 Nicotine dependence, cigarettes, uncomplicated: Secondary | ICD-10-CM | POA: Insufficient documentation

## 2018-04-25 DIAGNOSIS — J101 Influenza due to other identified influenza virus with other respiratory manifestations: Secondary | ICD-10-CM | POA: Insufficient documentation

## 2018-04-25 DIAGNOSIS — J111 Influenza due to unidentified influenza virus with other respiratory manifestations: Secondary | ICD-10-CM

## 2018-04-25 DIAGNOSIS — R69 Illness, unspecified: Secondary | ICD-10-CM

## 2018-04-25 MED ORDER — ONDANSETRON 4 MG PO TBDP: 4.0000 mg | ORAL_TABLET | Freq: Three times a day (TID) | ORAL | 0 refills | Status: DC | PRN

## 2018-04-25 MED ORDER — GUAIFENESIN 100 MG/5ML PO SYRP: 100.0000 mg | ORAL_SOLUTION | ORAL | 0 refills | Status: DC | PRN

## 2018-04-25 NOTE — Discharge Instructions (Addendum)
Evaluated today for flulike illness.  You did not want the Tamiflu prescription.  I given a prescription for Zofran.  Please take as prescribed.  You placed this medication under your tongue and they will dissolve.  May also take Tylenol and ibuprofen as needed for fever and body aches and pains.  Also prescribed you cough syrup.  Please take as prescribed.  Please remain out of work until you are 24 hours without fever evaluation Tylenol or ibuprofen.  Return to the ED for any new or worsening symptoms.

## 2018-04-25 NOTE — ED Provider Notes (Signed)
[facility] EMERGENCY DEPARTMENT Provider Note   CSN: [id number] Arrival date & time: 04/25/18  2035  History   Chief Complaint Chief Complaint  Patient presents with  . Influenza    HPI Tiffany Weiss is a 33 y.o. female with no significant past medical history who presents for evaluation of flulike symptoms.  Patient states she has had fever, cough, congestion, rhinorrhea, body aches and pains, nausea, vomiting and diarrhea as well as sore throat since yesterday.  Patient states she has had 2 episodes of NBNB.  Had one episode of diarrhea yesterday which was nonbloody.  Patient has felt warm, however has not taken her temperature at home.  Has not taken anything for symptoms PTA.  Patient states her mother as well as her brother had similar symptoms 4 days ago.  States she is unable to tolerate p.o. intake, however has had decreased appetite for solid foods over the last 24 hours.  Denies recent travel or known exposure to COVID 19+ patients.  Denies receiving influenza vaccine.  Denies headache, neck pain, neck stiffness, drooling, dysphasia, chest pain, shortness of breath, abdominal pain, dysuria, pelvic pain.  Cough nonproductive in nature.  History provided by patient.  No interpreter was used.     HPI  Past Medical History:  Diagnosis Date  . Vitamin D deficiency disease     There are no active problems to display for this patient.   Past Surgical History:  Procedure Laterality Date  . CESAREAN SECTION    . TUBAL LIGATION       OB History   No obstetric history on file.      Home Medications    Prior to Admission medications   Medication Sig Start Date End Date Taking? Authorizing Provider  cephALEXin (KEFLEX) 500 MG capsule Take 1 capsule (500 mg total) by mouth 4 (four) times daily. 09/02/17   Khatri, Hina, PA-C  cyclobenzaprine (FLEXERIL) 5 MG tablet Take 1 tablet (5 mg total) by mouth 3 (three) times daily as needed. 08/01/16   Janne Napoleon,  NP  diclofenac (VOLTAREN) 50 MG EC tablet Take 1 tablet (50 mg total) by mouth 2 (two) times daily. 08/01/16   Janne Napoleon, NP  guaifenesin (ROBITUSSIN) 100 MG/5ML syrup Take 5-10 mLs (100-200 mg total) by mouth every 4 (four) hours as needed for cough. 04/25/18   Maddie Brazier A, PA-C  ondansetron (ZOFRAN ODT) 4 MG disintegrating tablet Take 1 tablet (4 mg total) by mouth every 8 (eight) hours as needed for nausea or vomiting. 04/25/18   Hedi Barkan A, PA-C  ondansetron (ZOFRAN) 4 MG tablet Take 1 tablet (4 mg total) by mouth every 6 (six) hours. 01/18/18   Rodriguez-Southworth, Nettie Elm, PA-C  predniSONE (DELTASONE) 20 MG tablet Take 2 tablets (40 mg total) by mouth daily. 10/22/16   Bethel Born, PA-C    Family History Family History  Problem Relation Age of Onset  . Heart failure Mother   . Diabetes Mother   . Hypertension Mother     Social History Social History   Tobacco Use  . Smoking status: Current Every Day Smoker    Packs/day: 0.40    Types: Cigarettes  . Smokeless tobacco: Never Used  Substance Use Topics  . Alcohol use: Yes  . Drug use: No     Allergies   Patient has no known allergies.   Review of Systems Review of Systems  Constitutional: Positive for appetite change. Negative for chills.  Subjective fever.  HENT: Positive for congestion, postnasal drip, rhinorrhea and sore throat. Negative for dental problem, drooling, ear discharge, ear pain, sinus pressure, sinus pain, sneezing, tinnitus, trouble swallowing and voice change.   Eyes: Negative.   Respiratory: Positive for cough. Negative for apnea, choking, chest tightness, shortness of breath, wheezing and stridor.   Cardiovascular: Negative.   Gastrointestinal: Positive for diarrhea, nausea and vomiting. Negative for abdominal pain, anal bleeding, blood in stool, constipation and rectal pain.  Genitourinary: Negative.   Musculoskeletal: Negative.   Skin: Negative.   All other systems reviewed  and are negative.    Physical Exam Updated Vital Signs BP (!) 147/103   Pulse 95   Temp 99 F (37.2 C) (Oral)   Resp 16   Ht 5\' 2"  (1.575 m)   Wt 77.1 kg   SpO2 100%   BMI 31.09 kg/m   Physical Exam Vitals signs and nursing note reviewed.  Constitutional:      General: She is not in acute distress.    Appearance: She is well-developed. She is not ill-appearing, toxic-appearing or diaphoretic.     Comments: Texting on phone and drinking on initial evaluation.  No acute distress noted.  HENT:     Head: Normocephalic and atraumatic.     Right Ear: Tympanic membrane, ear canal and external ear normal. There is no impacted cerumen.     Left Ear: Tympanic membrane, ear canal and external ear normal. There is no impacted cerumen.     Nose:     Comments: Clear rhinorrhea and congestion to bilateral nares.    Mouth/Throat:     Comments: Posterior oropharynx clear.  Mucous membranes moist.  Tonsils without edema or exudate.  No evidence of PTA or RPA.  Uvula midline without deviation.  No drooling, dysphasia or trismus.  Phonation normal. Eyes:     Pupils: Pupils are equal, round, and reactive to light.  Neck:     Musculoskeletal: Normal range of motion.     Comments: No Neck stiffness or neck rigidity.  No meningismus. Cardiovascular:     Rate and Rhythm: Normal rate.     Pulses: Normal pulses.     Heart sounds: Normal heart sounds.  Pulmonary:     Effort: No respiratory distress.     Comments: Clear to auscultation bilateral without wheeze, rhonchi or rales.  No accessory muscle usage.  Able to speak in full sentences without difficulty Abdominal:     General: There is no distension.     Comments: Soft, nontender without rebound or guarding.  No CVA tenderness.  Musculoskeletal: Normal range of motion.     Comments: Moves all 4 extremities without difficulty.  Ambulatory in department without difficulty.  Skin:    General: Skin is warm and dry.     Comments: No rashes or  lesions.  Brisk capillary refill.  Neurological:     Mental Status: She is alert.      ED Treatments / Results  Labs (all labs ordered are listed, but only abnormal results are displayed) Labs Reviewed - No data to display  EKG None  Radiology No results found.  Procedures Procedures (including critical care time)  Medications Ordered in ED Medications - No data to display   Initial Impression / Assessment and Plan / ED Course  I have reviewed the triage vital signs and the nursing notes.  Pertinent labs & imaging results that were available during my care of the patient were reviewed by me and considered  in my medical decision making (see chart for details).  45 old female appears otherwise well presents for evaluation of flulike symptoms.  Afebrile, nonseptic, non-ill-appearing. Sx onset less than 24 hours PTA. Family members with similar symptoms. Able to tolerate p.o. intake without difficulty. Posterior oropharynx clear. No tonsillar edema or exudate.  No evidence of PTA or RPA.  No drooling, dysphasia or trismus.  Uvula midline without deviation.  Low suspicion for pharyngitis.  Cough nonproductive in nature.  Lungs clear to auscultation bilaterally without wheeze, rhonchi or rales.  No tachypnea, tachycardia or hypoxia, low suspicion for pneumonia.  No neck stiffness or neck rigidity, no meningismus, low suspicion for meningitis.  Ears without evidence of otitis.  Abdomen soft, nontender without rebound or guarding. No urinary sx. Patient does not meet the SIRS or Sepsis criteria.  On repeat exam patient does not have a surgical abdomin and there are no peritoneal signs.  No indication of appendicitis, bowel obstruction, bowel perforation, cholecystitis, diverticulitis.    Patient with symptoms consistent with influenza. Vitals are stable.  No signs of dehydration, tolerating PO's in ED without difficulty.  Lungs are clear. Due to patient's presentation and physical exam a  chest x-ray was not ordered bc likely diagnosis of flu.  Discussed the cost versus benefit of Tamiflu treatment with the patient.  Patient voiced understanding side effects of Tamiflu and has deferred Tamiflu prescription at this time. Patient will be discharged with instructions to orally hydrate, rest, and use over-the-counter medications such as anti-inflammatories ibuprofen and Aleve for muscle aches and Tylenol for fever.  Patient will also be given a cough suppressant.  Hemodynamically stable and appropriate for DC home at this time.  Has had mild elevation in her blood pressure in department.  She is asymptomatic without headache, vision changes, chest pain, shortness of breath, abdominal pain.  Discussed follow-up with PCP for reevaluation.  Discussed strict return precautions with patient.  Patient voiced understanding and is agreeable for follow-up.     Final Clinical Impressions(s) / ED Diagnoses   Final diagnoses:  Influenza-like illness    ED Discharge Orders         Ordered    ondansetron (ZOFRAN ODT) 4 MG disintegrating tablet  Every 8 hours PRN     04/25/18 2106    guaifenesin (ROBITUSSIN) 100 MG/5ML syrup  Every 4 hours PRN     04/25/18 2106           Lennox Leikam A, PA-C 04/25/18 2108    Melene Plan, DO 04/25/18 2239

## 2018-04-25 NOTE — ED Triage Notes (Signed)
Pt states that since yesterday she has been having fever, congestion, n/v/d, sore throat and body aches.

## 2018-04-25 NOTE — ED Notes (Signed)
Reviewed d/c instructions with pt, who verbalized understanding and had no outstanding questions. Unable to obtain signature d/t nonfunctional pad on computer. Pt armband and labels removed and placed in shred bin. Pt departed in NAD, refused use of wheelchair.
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
## 2018-11-12 ENCOUNTER — Emergency Department (HOSPITAL_COMMUNITY)
Admission: EM | Admit: 2018-11-12 | Discharge: 2018-11-12 | Disposition: A | Discharge status: Home / Self Care | Payer: Self-pay | Attending: Emergency Medicine | Admitting: Emergency Medicine

## 2018-11-12 ENCOUNTER — Other Ambulatory Visit: Payer: Self-pay

## 2018-11-12 ENCOUNTER — Encounter (HOSPITAL_COMMUNITY): Payer: Self-pay | Admitting: Emergency Medicine

## 2018-11-12 DIAGNOSIS — S61216A Laceration without foreign body of right little finger without damage to nail, initial encounter: Secondary | ICD-10-CM | POA: Insufficient documentation

## 2018-11-12 DIAGNOSIS — W260XXA Contact with knife, initial encounter: Secondary | ICD-10-CM | POA: Insufficient documentation

## 2018-11-12 DIAGNOSIS — Z23 Encounter for immunization: Secondary | ICD-10-CM | POA: Insufficient documentation

## 2018-11-12 DIAGNOSIS — F17210 Nicotine dependence, cigarettes, uncomplicated: Secondary | ICD-10-CM | POA: Insufficient documentation

## 2018-11-12 DIAGNOSIS — Y929 Unspecified place or not applicable: Secondary | ICD-10-CM | POA: Insufficient documentation

## 2018-11-12 DIAGNOSIS — Z791 Long term (current) use of non-steroidal anti-inflammatories (NSAID): Secondary | ICD-10-CM | POA: Insufficient documentation

## 2018-11-12 DIAGNOSIS — Y999 Unspecified external cause status: Secondary | ICD-10-CM | POA: Insufficient documentation

## 2018-11-12 DIAGNOSIS — Y9389 Activity, other specified: Secondary | ICD-10-CM | POA: Insufficient documentation

## 2018-11-12 MED ORDER — TETANUS-DIPHTH-ACELL PERTUSSIS 5-2.5-18.5 LF-MCG/0.5 IM SUSP
0.5000 mL | Freq: Once | INTRAMUSCULAR | Status: AC
  Administered 2018-11-12: 0.5 mL via INTRAMUSCULAR
  Filled 2018-11-12: qty 0.5

## 2018-11-12 MED ORDER — BACITRACIN ZINC 500 UNIT/GM EX OINT
TOPICAL_OINTMENT | Freq: Two times a day (BID) | CUTANEOUS | Status: DC
  Administered 2018-11-12: 1 via TOPICAL
  Filled 2018-11-12: qty 3.6

## 2018-11-12 NOTE — ED Triage Notes (Signed)
Pt reports that she was sharpening her knives today when cut her 5th finger on her right hand pt has finger bandaged

## 2018-11-12 NOTE — Discharge Instructions (Addendum)
Keep wound clean and dry.

## 2018-11-12 NOTE — ED Provider Notes (Signed)
[facility] DEPT Provider Note   CSN: [id number] Arrival date & time: 11/12/18  1745     History   Chief Complaint Chief Complaint  Patient presents with  . Finger Injury    HPI Tiffany Weiss is a 33 y.o. female.     33yo female presents with complaint of right 5th finger injury. Patient was sharpening her knife when she accidentally cut the tip of her right 5th finger. Patient is right hand dominant, not on blood thinners, last td unknown, no other injuries.      Past Medical History:  Diagnosis Date  . Vitamin D deficiency disease     There are no active problems to display for this patient.   Past Surgical History:  Procedure Laterality Date  . CESAREAN SECTION    . TUBAL LIGATION       OB History   No obstetric history on file.      Home Medications    Prior to Admission medications   Medication Sig Start Date End Date Taking? Authorizing Provider  cephALEXin (KEFLEX) 500 MG capsule Take 1 capsule (500 mg total) by mouth 4 (four) times daily. 09/02/17   Khatri, Hina, PA-C  cyclobenzaprine (FLEXERIL) 5 MG tablet Take 1 tablet (5 mg total) by mouth 3 (three) times daily as needed. 08/01/16   Ashley Murrain, NP  diclofenac (VOLTAREN) 50 MG EC tablet Take 1 tablet (50 mg total) by mouth 2 (two) times daily. 08/01/16   Ashley Murrain, NP  guaifenesin (ROBITUSSIN) 100 MG/5ML syrup Take 5-10 mLs (100-200 mg total) by mouth every 4 (four) hours as needed for cough. 04/25/18   Henderly, Britni A, PA-C  ondansetron (ZOFRAN ODT) 4 MG disintegrating tablet Take 1 tablet (4 mg total) by mouth every 8 (eight) hours as needed for nausea or vomiting. 04/25/18   Henderly, Britni A, PA-C  ondansetron (ZOFRAN) 4 MG tablet Take 1 tablet (4 mg total) by mouth every 6 (six) hours. 01/18/18   Rodriguez-Southworth, Sunday Spillers, PA-C  predniSONE (DELTASONE) 20 MG tablet Take 2 tablets (40 mg total) by mouth daily. 10/22/16   Recardo Evangelist, PA-C    Family  History Family History  Problem Relation Age of Onset  . Heart failure Mother   . Diabetes Mother   . Hypertension Mother     Social History Social History   Tobacco Use  . Smoking status: Current Every Day Smoker    Packs/day: 0.40    Types: Cigarettes  . Smokeless tobacco: Never Used  Substance Use Topics  . Alcohol use: Yes  . Drug use: No     Allergies   Patient has no known allergies.   Review of Systems Review of Systems  Constitutional: Negative for fever.  Musculoskeletal: Negative for arthralgias and myalgias.  Skin: Positive for wound.  Allergic/Immunologic: Negative for immunocompromised state.  Neurological: Negative for weakness and numbness.  Hematological: Does not bruise/bleed easily.  All other systems reviewed and are negative.    Physical Exam Updated Vital Signs BP (!) 156/92   Pulse 74   Temp 98.9 F (37.2 C) (Oral)   Resp 17   LMP 10/21/2018   SpO2 97%   Physical Exam Vitals signs and nursing note reviewed.  Constitutional:      General: She is not in acute distress.    Appearance: She is well-developed. She is not diaphoretic.  HENT:     Head: Normocephalic and atraumatic.  Cardiovascular:     Pulses: Normal  pulses.  Pulmonary:     Effort: Pulmonary effort is normal.  Musculoskeletal: Normal range of motion.        General: Tenderness and signs of injury present. No swelling or deformity.       Hands:  Skin:    General: Skin is warm and dry.     Capillary Refill: Capillary refill takes less than 2 seconds.     Findings: No erythema or rash.  Neurological:     Mental Status: She is alert and oriented to person, place, and time.     Sensory: No sensory deficit.  Psychiatric:        Behavior: Behavior normal.      ED Treatments / Results  Labs (all labs ordered are listed, but only abnormal results are displayed) Labs Reviewed - No data to display  EKG None  Radiology No results found.  Procedures Procedures  (including critical care time)  Medications Ordered in ED Medications  Tdap (BOOSTRIX) injection 0.5 mL (has no administration in time range)  bacitracin ointment (has no administration in time range)     Initial Impression / Assessment and Plan / ED Course  I have reviewed the triage vital signs and the nursing notes.  Pertinent labs & imaging results that were available during my care of the patient were reviewed by me and considered in my medical decision making (see chart for details).  Clinical Course as of Nov 11 1821  Fri Nov 12, 2018  7106 34yo female with right 5th finger laceration after sharpening a knife. No active bleeding, minor wound, not involving the nail. Offered dermabond to tip of finger vs bacitracin and bulky dressing, patient works in a  deli, prefers bacitracin and a bandage which is reasonable as wound is not bleeding. Tdap updated.    [LM]    Clinical Course User Index [LM] Jeannie Fend, PA-C      Final Clinical Impressions(s) / ED Diagnoses   Final diagnoses:  Laceration of right little finger without foreign body without damage to nail, initial encounter    ED Discharge Orders    None       Alden Hipp 11/12/18 1823    Milagros Loll, MD 11/13/18 0200

## 2020-06-07 ENCOUNTER — Other Ambulatory Visit: Payer: Self-pay

## 2020-06-07 ENCOUNTER — Ambulatory Visit (HOSPITAL_COMMUNITY)
Admission: EM | Admit: 2020-06-07 | Discharge: 2020-06-07 | Disposition: A | Discharge status: Home / Self Care | Payer: Medicaid Other | Attending: Emergency Medicine | Admitting: Emergency Medicine

## 2020-06-07 ENCOUNTER — Encounter (HOSPITAL_COMMUNITY): Payer: Self-pay | Admitting: Emergency Medicine

## 2020-06-07 DIAGNOSIS — M542 Cervicalgia: Secondary | ICD-10-CM

## 2020-06-07 MED ORDER — CYCLOBENZAPRINE HCL 5 MG PO TABS: 5.0000 mg | ORAL_TABLET | Freq: Every day | ORAL | 0 refills | Status: DC

## 2020-06-07 MED ORDER — PREDNISONE 20 MG PO TABS: 40.0000 mg | ORAL_TABLET | Freq: Every day | ORAL | 0 refills | Status: DC

## 2020-06-07 NOTE — ED Triage Notes (Signed)
Pt presents with headache on right side that radiates into neck xs 3 days. States ibuprofen gives minimal relief. Denies any visual changes or dizziness.

## 2020-06-07 NOTE — Discharge Instructions (Addendum)
Take 2 pill daily for 5 days  Can use muscle relaxer at bedtime as needed   Heating pad 15 minute intervals, can put rice in sock, heat in microwave if you do not own heating pad  Pillow support as needed

## 2020-06-07 NOTE — ED Provider Notes (Signed)
[facility]    CSN: [id number] Arrival date & time: 06/07/20  1128      History   Chief Complaint Chief Complaint  Patient presents with  . Headache    HPI Tiffany Weiss is a 35 y.o. female.   Patient presents with right sided neck pain pulsating radiating into head causing headache starting three days ago. ROM intact. Pain can be felt with twisting and turning. Denies numbness, tingling, precipitating event, prior injury. Unsure if she slept wrong. Denies photophobia, phonophobia, visual changes, nausea. Using 600 mg ibuprofen every 4-6 hours without relief.   Past Medical History:  Diagnosis Date  . Vitamin D deficiency disease     There are no problems to display for this patient.   Past Surgical History:  Procedure Laterality Date  . CESAREAN SECTION    . TUBAL LIGATION      OB History   No obstetric history on file.      Home Medications    Prior to Admission medications   Medication Sig Start Date End Date Taking? Authorizing Provider  predniSONE (DELTASONE) 20 MG tablet Take 2 tablets (40 mg total) by mouth daily. 06/07/20  Yes Jais Demir R, NP  cephALEXin (KEFLEX) 500 MG capsule Take 1 capsule (500 mg total) by mouth 4 (four) times daily. 09/02/17   Khatri, Hina, PA-C  cyclobenzaprine (FLEXERIL) 5 MG tablet Take 1 tablet (5 mg total) by mouth at bedtime. 06/07/20   Valinda Hoar, NP  diclofenac (VOLTAREN) 50 MG EC tablet Take 1 tablet (50 mg total) by mouth 2 (two) times daily. 08/01/16   Janne Napoleon, NP  guaifenesin (ROBITUSSIN) 100 MG/5ML syrup Take 5-10 mLs (100-200 mg total) by mouth every 4 (four) hours as needed for cough. 04/25/18   Henderly, Britni A, PA-C  ondansetron (ZOFRAN ODT) 4 MG disintegrating tablet Take 1 tablet (4 mg total) by mouth every 8 (eight) hours as needed for nausea or vomiting. 04/25/18   Henderly, Britni A, PA-C  ondansetron (ZOFRAN) 4 MG tablet Take 1 tablet (4 mg total) by mouth every 6 (six) hours. 01/18/18    Rodriguez-Southworth, Nettie Elm, PA-C    Family History Family History  Problem Relation Age of Onset  . Heart failure Mother   . Diabetes Mother   . Hypertension Mother     Social History Social History   Tobacco Use  . Smoking status: Current Every Day Smoker    Packs/day: 0.40    Types: Cigarettes  . Smokeless tobacco: Never Used  Substance Use Topics  . Alcohol use: Yes  . Drug use: No     Allergies   Patient has no known allergies.   Review of Systems Review of Systems  Defer to HPI   Physical Exam Triage Vital Signs ED Triage Vitals  Enc Vitals Group     BP 06/07/20 1155 (!) 157/98     Pulse Rate 06/07/20 1155 62     Resp 06/07/20 1155 16     Temp 06/07/20 1155 98.5 F (36.9 C)     Temp Source 06/07/20 1155 Oral     SpO2 06/07/20 1155 98 %     Weight --      Height --      Head Circumference --      Peak Flow --      Pain Score 06/07/20 1153 5     Pain Loc --      Pain Edu? --      Excl.  in GC? --    No data found.  Updated Vital Signs BP (!) 157/98 (BP Location: Left Arm)   Pulse 62   Temp 98.5 F (36.9 C) (Oral)   Resp 16   LMP 05/30/2020   SpO2 98%   Visual Acuity Right Eye Distance:   Left Eye Distance:   Bilateral Distance:    Right Eye Near:   Left Eye Near:    Bilateral Near:     Physical Exam Constitutional:      Appearance: She is well-developed and normal weight.  HENT:     Head: Normocephalic.  Eyes:     Extraocular Movements: Extraocular movements intact.  Neck:   Pulmonary:     Effort: Pulmonary effort is normal.  Musculoskeletal:        General: Normal range of motion.     Cervical back: Normal range of motion. Pain with movement and muscular tenderness present. Normal range of motion.  Skin:    General: Skin is warm and dry.  Neurological:     General: No focal deficit present.     Mental Status: She is alert and oriented to person, place, and time. Mental status is at baseline.  Psychiatric:        Mood  and Affect: Mood normal.        Behavior: Behavior normal.        Thought Content: Thought content normal.        Judgment: Judgment normal.      UC Treatments / Results  Labs (all labs ordered are listed, but only abnormal results are displayed) Labs Reviewed - No data to display  EKG   Radiology No results found.  Procedures Procedures (including critical care time)  Medications Ordered in UC Medications - No data to display  Initial Impression / Assessment and Plan / UC Course  I have reviewed the triage vital signs and the nursing notes.  Pertinent labs & imaging results that were available during my care of the patient were reviewed by me and considered in my medical decision making (see chart for details).  Acute neck pain  1. Prednisone 40 mg daily for 5 days 2. Flexeril 5 mg at bedtime prn 3. Heating pad 15 minute intervals  4. Pillow support 5. Ortho follow up for persistent pain Final Clinical Impressions(s) / UC Diagnoses   Final diagnoses:  Acute neck pain     Discharge Instructions     Take 2 pill daily for 5 days  Can use muscle relaxer at bedtime as needed   Heating pad 15 minute intervals, can put rice in sock, heat in microwave if you do not own heating pad  Pillow support as needed    ED Prescriptions    Medication Sig Dispense Auth. Provider   cyclobenzaprine (FLEXERIL) 5 MG tablet Take 1 tablet (5 mg total) by mouth at bedtime. 15 tablet Chloe Flis R, NP   predniSONE (DELTASONE) 20 MG tablet Take 2 tablets (40 mg total) by mouth daily. 10 tablet Valinda Hoar, NP     PDMP not reviewed this encounter.   Valinda Hoar, NP 06/07/20 1233

## 2020-11-30 ENCOUNTER — Ambulatory Visit (HOSPITAL_COMMUNITY)
Admission: EM | Admit: 2020-11-30 | Discharge: 2020-11-30 | Disposition: A | Discharge status: Home / Self Care | Payer: Medicaid Other | Attending: Medical Oncology | Admitting: Medical Oncology

## 2020-11-30 ENCOUNTER — Other Ambulatory Visit: Payer: Self-pay

## 2020-11-30 ENCOUNTER — Encounter (HOSPITAL_COMMUNITY): Payer: Self-pay | Admitting: Emergency Medicine

## 2020-11-30 DIAGNOSIS — R0981 Nasal congestion: Secondary | ICD-10-CM | POA: Insufficient documentation

## 2020-11-30 DIAGNOSIS — J029 Acute pharyngitis, unspecified: Secondary | ICD-10-CM | POA: Insufficient documentation

## 2020-11-30 LAB — POCT RAPID STREP A, ED / UC: Streptococcus, Group A Screen (Direct): NEGATIVE

## 2020-11-30 MED ORDER — FLUTICASONE PROPIONATE 50 MCG/ACT NA SUSP: 2.0000 | Freq: Every day | NASAL | 0 refills | Status: DC

## 2020-11-30 NOTE — ED Provider Notes (Signed)
[facility]    CSN: [id number] Arrival date & time: 11/30/20  1027      History   Chief Complaint Chief Complaint  Patient presents with   Nasal Congestion   Sore Throat    HPI Tiffany Weiss is a 35 y.o. female.   HPI  Sore Throat: Patient presents with her daughter and son who have similar symptoms.  Patient reports that for the past few days she has had nasal congestion and a sore throat.  No known sick contacts but she is concerned about strep pharyngitis.  They have used ginger tea, lemon and honey as well as vitamin C supplement without improvement.  They deny any cough, fever or vomiting.  Past Medical History:  Diagnosis Date   Vitamin D deficiency disease     There are no problems to display for this patient.   Past Surgical History:  Procedure Laterality Date   CESAREAN SECTION     TUBAL LIGATION      OB History   No obstetric history on file.      Home Medications    Prior to Admission medications   Medication Sig Start Date End Date Taking? Authorizing Provider  cephALEXin (KEFLEX) 500 MG capsule Take 1 capsule (500 mg total) by mouth 4 (four) times daily. 09/02/17   Khatri, Hina, PA-C  cyclobenzaprine (FLEXERIL) 5 MG tablet Take 1 tablet (5 mg total) by mouth at bedtime. 06/07/20   Valinda Hoar, NP  diclofenac (VOLTAREN) 50 MG EC tablet Take 1 tablet (50 mg total) by mouth 2 (two) times daily. 08/01/16   Janne Napoleon, NP  guaifenesin (ROBITUSSIN) 100 MG/5ML syrup Take 5-10 mLs (100-200 mg total) by mouth every 4 (four) hours as needed for cough. 04/25/18   Henderly, Britni A, PA-C  ondansetron (ZOFRAN ODT) 4 MG disintegrating tablet Take 1 tablet (4 mg total) by mouth every 8 (eight) hours as needed for nausea or vomiting. 04/25/18   Henderly, Britni A, PA-C  ondansetron (ZOFRAN) 4 MG tablet Take 1 tablet (4 mg total) by mouth every 6 (six) hours. 01/18/18   Rodriguez-Southworth, Nettie Elm, PA-C  predniSONE (DELTASONE) 20 MG tablet Take 2  tablets (40 mg total) by mouth daily. 06/07/20   Valinda Hoar, NP    Family History Family History  Problem Relation Age of Onset   Heart failure Mother    Diabetes Mother    Hypertension Mother     Social History Social History   Tobacco Use   Smoking status: Every Day    Packs/day: 0.40    Types: Cigarettes   Smokeless tobacco: Never  Substance Use Topics   Alcohol use: Yes   Drug use: No     Allergies   Patient has no known allergies.   Review of Systems Review of Systems  As stated above in HPI Physical Exam Triage Vital Signs ED Triage Vitals [11/30/20 1122]  Enc Vitals Group     BP (!) 154/92     Pulse Rate 75     Resp 18     Temp 98.5 F (36.9 C)     Temp Source Oral     SpO2 99 %     Weight 191 lb (86.6 kg)     Height      Head Circumference      Peak Flow      Pain Score      Pain Loc      Pain Edu?  Excl. in GC?    No data found.  Updated Vital Signs BP (!) 154/92 (BP Location: Left Arm)   Pulse 75   Temp 98.5 F (36.9 C) (Oral)   Resp 18   Wt 191 lb (86.6 kg)   LMP 11/25/2020   SpO2 99%   BMI 34.93 kg/m   Physical Exam Vitals and nursing note reviewed.  Constitutional:      General: She is not in acute distress.    Appearance: She is well-developed. She is not ill-appearing, toxic-appearing or diaphoretic.  HENT:     Head: Normocephalic and atraumatic.     Right Ear: Tympanic membrane normal. No middle ear effusion. Tympanic membrane is not erythematous.     Left Ear: Tympanic membrane normal.  No middle ear effusion. Tympanic membrane is not erythematous.     Nose: Congestion (mild) and rhinorrhea (mild) present.     Mouth/Throat:     Mouth: Mucous membranes are moist. No oral lesions.     Pharynx: Oropharynx is clear. Uvula midline. Posterior oropharyngeal erythema (mild) present. No pharyngeal swelling, oropharyngeal exudate or uvula swelling.     Tonsils: No tonsillar exudate or tonsillar abscesses. 2+ on the  right. 2+ on the left.  Eyes:     Conjunctiva/sclera: Conjunctivae normal.     Pupils: Pupils are equal, round, and reactive to light.  Cardiovascular:     Rate and Rhythm: Normal rate and regular rhythm.     Heart sounds: Normal heart sounds.  Pulmonary:     Effort: Pulmonary effort is normal.     Breath sounds: Normal breath sounds.  Musculoskeletal:     Cervical back: Normal range of motion and neck supple.  Lymphadenopathy:     Cervical: Cervical adenopathy present.  Skin:    General: Skin is warm.     Findings: No rash.  Neurological:     Mental Status: She is alert and oriented to person, place, and time.     UC Treatments / Results  Labs (all labs ordered are listed, but only abnormal results are displayed) Labs Reviewed  CULTURE, GROUP A STREP A [facility])  POCT RAPID STREP A, ED / UC    EKG   Radiology No results found.  Procedures Procedures (including critical care time)  Medications Ordered in UC Medications - No data to display  Initial Impression / Assessment and Plan / UC Course  I have reviewed the triage vital signs and the nursing notes.  Pertinent labs & imaging results that were available during my care of the patient were reviewed by me and considered in my medical decision making (see chart for details).     New.  Likely viral in nature however strep test is pending.  For now treating with Flonase, rest, hydration and over-the-counter medications as needed for symptoms.  Discussed red flag signs and symptoms.  Follow-up as needed. Final Clinical Impressions(s) / UC Diagnoses   Final diagnoses:  None   Discharge Instructions   None    ED Prescriptions   None    PDMP not reviewed this encounter.   Rushie Chestnut, [place] 12/05/20 1950

## 2020-11-30 NOTE — ED Triage Notes (Signed)
Pt had congestion and sore throat for a couple days.

## 2020-12-02 LAB — CULTURE, GROUP A STREP (THRC)

## 2020-12-08 ENCOUNTER — Encounter: Payer: Self-pay | Admitting: Emergency Medicine

## 2020-12-08 ENCOUNTER — Other Ambulatory Visit: Payer: Self-pay

## 2020-12-08 ENCOUNTER — Ambulatory Visit
Admission: EM | Admit: 2020-12-08 | Discharge: 2020-12-08 | Disposition: A | Discharge status: Home / Self Care | Payer: Self-pay | Attending: Internal Medicine | Admitting: Internal Medicine

## 2020-12-08 DIAGNOSIS — H65193 Other acute nonsuppurative otitis media, bilateral: Secondary | ICD-10-CM

## 2020-12-08 MED ORDER — PREDNISONE 20 MG PO TABS: 40.0000 mg | ORAL_TABLET | Freq: Every day | ORAL | 0 refills | Status: AC

## 2020-12-08 NOTE — Discharge Instructions (Signed)
You have been prescribed prednisone steroid to decrease inflammation in your ears associated with fluid.  Please follow-up with primary care or urgent care if symptoms persist.

## 2020-12-08 NOTE — ED Provider Notes (Signed)
[name]    CSN: [id number] Arrival date & time: 12/08/20  0820      History   Chief Complaint Chief Complaint  Patient presents with   Otalgia    HPI Tiffany Weiss is a 35 y.o. female.   Patient presents with bilateral ear discomfort that has been present for multiple days.  Patient was seen on 11/30/2020 with sore throat and ear discomfort and was prescribed Flonase at the time that has not provided any relief.  Patient reports sore throat and all the respiratory symptoms have resolved but ear discomfort has been persistent.  Denies ear pain or any decreased hearing.  Denies any drainage from the ear.  Denies any fevers.   Otalgia  Past Medical History:  Diagnosis Date   Vitamin D deficiency disease     There are no problems to display for this patient.   Past Surgical History:  Procedure Laterality Date   CESAREAN SECTION     TUBAL LIGATION      OB History   No obstetric history on file.      Home Medications    Prior to Admission medications   Medication Sig Start Date End Date Taking? Authorizing Provider  cephALEXin (KEFLEX) 500 MG capsule Take 1 capsule (500 mg total) by mouth 4 (four) times daily. 09/02/17  Yes Khatri, Hina, PA-C  cyclobenzaprine (FLEXERIL) 5 MG tablet Take 1 tablet (5 mg total) by mouth at bedtime. 06/07/20  Yes White, Adrienne R, NP  diclofenac (VOLTAREN) 50 MG EC tablet Take 1 tablet (50 mg total) by mouth 2 (two) times daily. 08/01/16  Yes Neese, Hope M, NP  fluticasone (FLONASE) 50 MCG/ACT nasal spray Place 2 sprays into both nostrils daily. 11/30/20  Yes Covington, Sarah M, PA-C  guaifenesin (ROBITUSSIN) 100 MG/5ML syrup Take 5-10 mLs (100-200 mg total) by mouth every 4 (four) hours as needed for cough. 04/25/18  Yes Henderly, Britni A, PA-C  ondansetron (ZOFRAN ODT) 4 MG disintegrating tablet Take 1 tablet (4 mg total) by mouth every 8 (eight) hours as needed for nausea or vomiting. 04/25/18  Yes Henderly, Britni A, PA-C   ondansetron (ZOFRAN) 4 MG tablet Take 1 tablet (4 mg total) by mouth every 6 (six) hours. 01/18/18  Yes Rodriguez-Southworth, Nettie Elm, PA-C  predniSONE (DELTASONE) 20 MG tablet Take 2 tablets (40 mg total) by mouth daily for 5 days. 12/08/20 12/13/20 Yes Gustavus Bryant, FNP    Family History Family History  Problem Relation Age of Onset   Heart failure Mother    Diabetes Mother    Hypertension Mother     Social History Social History   Tobacco Use   Smoking status: Every Day    Packs/day: 0.40    Types: Cigarettes   Smokeless tobacco: Never  Substance Use Topics   Alcohol use: Yes   Drug use: No     Allergies   Patient has no known allergies.   Review of Systems Review of Systems Per HPI  Physical Exam Triage Vital Signs ED Triage Vitals [12/08/20 0857]  Enc Vitals Group     BP (!) 155/95     Pulse Rate 67     Resp 20     Temp 98 F (36.7 C)     Temp Source Oral     SpO2 99 %     Weight 192 lb (87.1 kg)     Height 5\' 2"  (1.575 m)     Head Circumference  Peak Flow      Pain Score 0     Pain Loc      Pain Edu?      Excl. in GC?    No data found.  Updated Vital Signs BP (!) 155/95 (BP Location: Right Arm)   Pulse 67   Temp 98 F (36.7 C) (Oral)   Resp 20   Ht 5\' 2"  (1.575 m)   Wt 192 lb (87.1 kg)   LMP 11/25/2020   SpO2 99%   BMI 35.12 kg/m   Visual Acuity Right Eye Distance:   Left Eye Distance:   Bilateral Distance:    Right Eye Near:   Left Eye Near:    Bilateral Near:     Physical Exam Constitutional:      General: She is not in acute distress.    Appearance: Normal appearance. She is not toxic-appearing or diaphoretic.  HENT:     Head: Normocephalic and atraumatic.     Right Ear: Ear canal and external ear normal. No decreased hearing noted. No drainage, swelling or tenderness. A middle ear effusion is present. Tympanic membrane is bulging. Tympanic membrane is not perforated or erythematous.     Left Ear: Ear canal and  external ear normal. No decreased hearing noted. No drainage, swelling or tenderness. A middle ear effusion is present. Tympanic membrane is not perforated, erythematous or bulging.     Nose: Nose normal.     Mouth/Throat:     Mouth: Mucous membranes are moist.     Pharynx: No posterior oropharyngeal erythema.  Eyes:     Extraocular Movements: Extraocular movements intact.     Conjunctiva/sclera: Conjunctivae normal.     Pupils: Pupils are equal, round, and reactive to light.  Cardiovascular:     Rate and Rhythm: Normal rate and regular rhythm.     Pulses: Normal pulses.     Heart sounds: Normal heart sounds.  Pulmonary:     Effort: Pulmonary effort is normal. No respiratory distress.     Breath sounds: Normal breath sounds.  Skin:    General: Skin is warm and dry.  Neurological:     General: No focal deficit present.     Mental Status: She is alert and oriented to person, place, and time. Mental status is at baseline.  Psychiatric:        Mood and Affect: Mood normal.        Behavior: Behavior normal.        Thought Content: Thought content normal.        Judgment: Judgment normal.     UC Treatments / Results  Labs (all labs ordered are listed, but only abnormal results are displayed) Labs Reviewed - No data to display  EKG   Radiology No results found.  Procedures Procedures (including critical care time)  Medications Ordered in UC Medications - No data to display  Initial Impression / Assessment and Plan / UC Course  I have reviewed the triage vital signs and the nursing notes.  Pertinent labs & imaging results that were available during my care of the patient were reviewed by me and considered in my medical decision making (see chart for details).     Will prescribe prednisone steroid to decrease inflammation associated with bilateral middle ear effusion due to right ear having bulging TM.  Symptoms have also been refractory to over-the-counter nasal sprays  and antihistamines.  Patient may discontinue Flonase.Discussed strict return precautions. Patient verbalized understanding and is agreeable with plan.  Final Clinical Impressions(s) / UC Diagnoses   Final diagnoses:  Acute MEE (middle ear effusion), bilateral     Discharge Instructions      You have been prescribed prednisone steroid to decrease inflammation in your ears associated with fluid.  Please follow-up with primary care or urgent care if symptoms persist.     ED Prescriptions     Medication Sig Dispense Auth. Provider   predniSONE (DELTASONE) 20 MG tablet Take 2 tablets (40 mg total) by mouth daily for 5 days. 10 tablet Gustavus Bryant, [place]      PDMP not reviewed this encounter.   Gustavus Bryant, [place] 12/08/20 [phone]

## 2020-12-08 NOTE — ED Triage Notes (Signed)
Patient c/o bilateral ear discomfort.  Patient states it feels like "fluid" in her ears causing drainage down throat.  Patient has been taken Flonase with no relief.  Patient is not vaccinated for COVID.

## 2020-12-12 ENCOUNTER — Encounter (HOSPITAL_COMMUNITY): Payer: Self-pay

## 2020-12-12 ENCOUNTER — Emergency Department (HOSPITAL_COMMUNITY)
Admission: EM | Admit: 2020-12-12 | Discharge: 2020-12-12 | Discharge status: Against Medical Advice | Payer: Self-pay | Attending: Emergency Medicine | Admitting: Emergency Medicine

## 2020-12-12 DIAGNOSIS — H6591 Unspecified nonsuppurative otitis media, right ear: Secondary | ICD-10-CM

## 2020-12-12 DIAGNOSIS — H9201 Otalgia, right ear: Secondary | ICD-10-CM | POA: Insufficient documentation

## 2020-12-12 NOTE — ED Provider Notes (Signed)
[facility]-EMERGENCY DEPT Provider Note   CSN: [id number] Arrival date & time: 12/12/20  6503     History No chief complaint on file.   Tiffany Weiss is a 35 y.o. female.  Patient is a 35 year old female with no known medical problems who is presenting today with persistent complaint with her ear.  Patient reports for the last 2 weeks or maybe a little bit more she has had issues with her hearing.  Started with the left ear and has moved to the right.  It is a sensation of muffled sounds decreased hearing and occasional discomfort.  She has been seen now this is her third time for similar issues.  Initially she was told that she had fluid in her ears and she started doing OTC medications.  She then went back because the symptoms were worsening and she was told she still had fluid in her ears.  She has not had fever, cough but it did start with some mild congestion.  She has no sore throat and reports that she just finished a course of prednisone she has been using Flonase as well as over-the-counter Zyrtec.  Her left ear is improving but her right ear continues to be a problem.  She also works at home and wears a headset and reports she is having a really hard time hearing. No drainage from the ear or trauma.  No history of seasonal allergies.  The history is provided by the patient.      Past Medical History:  Diagnosis Date   Vitamin D deficiency disease     There are no problems to display for this patient.   Past Surgical History:  Procedure Laterality Date   CESAREAN SECTION     TUBAL LIGATION       OB History   No obstetric history on file.     Family History  Problem Relation Age of Onset   Heart failure Mother    Diabetes Mother    Hypertension Mother     Social History   Tobacco Use   Smoking status: Every Day    Packs/day: 0.40    Types: Cigarettes   Smokeless tobacco: Never  Substance Use Topics   Alcohol use: Yes   Drug use:  No    Home Medications Prior to Admission medications   Medication Sig Start Date End Date Taking? Authorizing Provider  cephALEXin (KEFLEX) 500 MG capsule Take 1 capsule (500 mg total) by mouth 4 (four) times daily. 09/02/17   Khatri, Hina, PA-C  cyclobenzaprine (FLEXERIL) 5 MG tablet Take 1 tablet (5 mg total) by mouth at bedtime. 06/07/20   Valinda Hoar, NP  diclofenac (VOLTAREN) 50 MG EC tablet Take 1 tablet (50 mg total) by mouth 2 (two) times daily. 08/01/16   Janne Napoleon, NP  fluticasone (FLONASE) 50 MCG/ACT nasal spray Place 2 sprays into both nostrils daily. 11/30/20   Rushie Chestnut, PA-C  guaifenesin (ROBITUSSIN) 100 MG/5ML syrup Take 5-10 mLs (100-200 mg total) by mouth every 4 (four) hours as needed for cough. 04/25/18   Henderly, Britni A, PA-C  ondansetron (ZOFRAN ODT) 4 MG disintegrating tablet Take 1 tablet (4 mg total) by mouth every 8 (eight) hours as needed for nausea or vomiting. 04/25/18   Henderly, Britni A, PA-C  ondansetron (ZOFRAN) 4 MG tablet Take 1 tablet (4 mg total) by mouth every 6 (six) hours. 01/18/18   Rodriguez-Southworth, Nettie Elm, PA-C  predniSONE (DELTASONE) 20 MG tablet Take 2  tablets (40 mg total) by mouth daily for 5 days. 12/08/20 12/13/20  Gustavus Bryant, FNP    Allergies    Patient has no known allergies.  Review of Systems   Review of Systems  All other systems reviewed and are negative.  Physical Exam Updated Vital Signs Pulse 66   Temp 98.4 F (36.9 C) (Oral)   Resp 16   LMP 11/25/2020   SpO2 98%   Physical Exam Vitals and nursing note reviewed.  Constitutional:      General: She is not in acute distress.    Appearance: Normal appearance. She is well-developed.  HENT:     Head: Normocephalic and atraumatic.     Right Ear: Decreased hearing noted. No tenderness. A middle ear effusion is present. There is no impacted cerumen. No mastoid tenderness. Tympanic membrane is bulging. Tympanic membrane is not injected, perforated,  erythematous or retracted.     Left Ear: Hearing, tympanic membrane and ear canal normal.     Ears:     Comments: Right ear with middle ear effusion with clear fluid approximately 75%.  Bones that are visualized appear normal.    Nose: Nose normal. No rhinorrhea.     Mouth/Throat:     Mouth: Mucous membranes are moist.  Eyes:     Extraocular Movements: Extraocular movements intact.     Pupils: Pupils are equal, round, and reactive to light.  Cardiovascular:     Rate and Rhythm: Normal rate and regular rhythm.     Heart sounds: Normal heart sounds. No murmur heard.   No friction rub.  Pulmonary:     Effort: Pulmonary effort is normal.     Breath sounds: Normal breath sounds. No wheezing or rales.  Musculoskeletal:        General: No tenderness. Normal range of motion.     Cervical back: Normal range of motion and neck supple. No tenderness.     Comments: No edema  Lymphadenopathy:     Cervical: No cervical adenopathy.  Skin:    General: Skin is warm and dry.     Findings: No rash.  Neurological:     Mental Status: She is alert and oriented to person, place, and time. Mental status is at baseline.     Cranial Nerves: No cranial nerve deficit.  Psychiatric:        Mood and Affect: Mood normal.        Behavior: Behavior normal.    ED Results / Procedures / Treatments   Labs (all labs ordered are listed, but only abnormal results are displayed) Labs Reviewed - No data to display  EKG None  Radiology No results found.  Procedures Procedures   Medications Ordered in ED Medications - No data to display  ED Course  I have reviewed the triage vital signs and the nursing notes.  Pertinent labs & imaging results that were available during my care of the patient were reviewed by me and considered in my medical decision making (see chart for details).    MDM Rules/Calculators/A&P                           Patient presenting today with persistent right middle ear  effusion.  Decreased hearing.  No evidence of infection at this time as there is no erythema or purulent fluid noted.  She has no sinus tenderness concerning for sinus infection and denies any infectious symptoms.  She has been using Flonase,  recently completed a course of prednisone and over-the-counter Zyrtec.  Did not recommend that patient try saline spray and Nettie pot and hopeful effusion will resolve but was given information for ENT if symptoms persist as feel otherwise we have exhausted all other options.  No evidence of mastoiditis at this time.  No cervical adenopathy.  Patient otherwise well-appearing but is noted to be hypertensive today.  Looking back over her other 2 visits in the last week she has been persistently hypertensive.  She does not have a PCP due to lack of insurance.  Unclear how long she has been hypertensive but recommended checking her medication at home to ensure there is no pseudoephedrine or other blood pressure raising medications and what she is taking and also consulted transitional care team to help patient get a PCP.   Final Clinical Impression(s) / ED Diagnoses Final diagnoses:  Middle ear effusion, right    Rx / DC Orders ED Discharge Orders     None        Gwyneth Sprout, MD 12/12/20 [phone]

## 2020-12-12 NOTE — Progress Notes (Signed)
..  Transition of Care [facility]) - Emergency Department Mini Assessment   Patient Details  Name: Tiffany Weiss MRN: [id number] Date of Birth: 1985/06/12  Transition of Care [facility]) CM/SW Contact:    Larrie Kass, LCSW Phone Number: 12/12/2020, 9:32 AM   Clinical Narrative:  CSW made appointment with  [facility] and attached to pt's AVS.    Valentina Shaggy.Kassadie Pancake, MSW, LCSWA [facility]  Transitions of Care Clinical Social Worker I Direct Dial: [phone]  Fax: [phone] [email or web address]   ED Mini Assessment: What brought you to the Emergency Department? : complaint with her ear.  Barriers to Discharge: No Barriers Identified        Interventions which prevented an admission or readmission: Transportation Screening, Follow-up medical appointment    Patient Contact and Communications        ,                 Admission diagnosis:  ear ache There are no problems to display for this patient.  PCP:  Patient, No Pcp Per (Inactive) Pharmacy:   [facility] [address] ([address] [place] - [address] [place] ([place]) [place] [address] Phone: [phone] Fax: [phone]

## 2020-12-12 NOTE — Discharge Instructions (Signed)
You continue to have fluid on your right ear but there is no sign of infection today.  Continue using the Flonase nasal spray but also try using a Nettie pot or saline spray.  Check the Zyrtec that you have been taking at home and make sure it is just cetirizine and does not have any pseudoephedrine in it as that could raise your blood pressure.  Also your blood pressure today is elevated and it has been elevated at all your visits.  This might be something that is been going on for a while and it be important for you to follow-up with regular doctor to keep this checked.  You are also given the information for ear nose and throat because if your ear does not improve they will need to see you to hopefully be able to get the fluid out.

## 2020-12-12 NOTE — ED Triage Notes (Signed)
Pt states fluid in right ear.

## 2021-01-01 ENCOUNTER — Other Ambulatory Visit: Payer: Self-pay

## 2021-01-01 ENCOUNTER — Ambulatory Visit
Admission: EM | Admit: 2021-01-01 | Discharge: 2021-01-01 | Disposition: A | Discharge status: Home / Self Care | Payer: Self-pay | Attending: Physician Assistant | Admitting: Physician Assistant

## 2021-01-01 DIAGNOSIS — R0981 Nasal congestion: Secondary | ICD-10-CM

## 2021-01-01 DIAGNOSIS — J392 Other diseases of pharynx: Secondary | ICD-10-CM

## 2021-01-01 DIAGNOSIS — J302 Other seasonal allergic rhinitis: Secondary | ICD-10-CM

## 2021-01-01 MED ORDER — METHYLPREDNISOLONE ACETATE 40 MG/ML IJ SUSP
60.0000 mg | Freq: Once | INTRAMUSCULAR | Status: AC
  Administered 2021-01-01: 60 mg via INTRAMUSCULAR

## 2021-01-01 MED ORDER — LEVOCETIRIZINE DIHYDROCHLORIDE 5 MG PO TABS: 5.0000 mg | ORAL_TABLET | Freq: Every evening | ORAL | 0 refills | Status: DC

## 2021-01-01 NOTE — ED Provider Notes (Signed)
[name]    CSN: [id number] Arrival date & time: 01/01/21  0956      History   Chief Complaint Chief Complaint  Patient presents with   Nasal Congestion   Cough    HPI Tiffany Weiss is a 35 y.o. female.   Patient presents today with a several month history of intermittent URI symptoms.  She has been seen by our clinic as well as the emergency room several times for similar symptoms as recently as 12/12/2020.  She has been started on Flonase as well as given prednisone.  Does report temporary resolution of symptoms with steroid use until recurrence.  Current episode started approximately 4 days ago.  Reports scratchy throat, nasal congestion, pressure in bilateral ears.  Denies any fever, nausea, vomiting, chest pain, shortness of breath.  Denies any known sick contacts.  She has tried NyQuil, Mucinex, Flonase without improvement of symptoms.  She does report that sore throat symptoms have improved since onset.  She is eating and drinking normally.  She has not seen an allergist or an ENT in the past.   Past Medical History:  Diagnosis Date   Vitamin D deficiency disease     There are no problems to display for this patient.   Past Surgical History:  Procedure Laterality Date   CESAREAN SECTION     TUBAL LIGATION      OB History   No obstetric history on file.      Home Medications    Prior to Admission medications   Medication Sig Start Date End Date Taking? Authorizing Provider  levocetirizine (XYZAL ALLERGY 24HR) 5 MG tablet Take 1 tablet (5 mg total) by mouth every evening. 01/01/21  Yes Alvilda Mckenna K, PA-C  fluticasone (FLONASE) 50 MCG/ACT nasal spray Place 2 sprays into both nostrils daily. 11/30/20   Rushie Chestnut, PA-C    Family History Family History  Problem Relation Age of Onset   Heart failure Mother    Diabetes Mother    Hypertension Mother     Social History Social History   Tobacco Use   Smoking status: Every Day     Packs/day: 0.40    Types: Cigarettes   Smokeless tobacco: Never  Substance Use Topics   Alcohol use: Yes   Drug use: No     Allergies   Patient has no known allergies.   Review of Systems Review of Systems  Constitutional:  Positive for activity change. Negative for appetite change, fatigue and fever.  HENT:  Positive for congestion, postnasal drip and sore throat. Negative for sinus pressure and sneezing.   Respiratory:  Positive for cough. Negative for shortness of breath.   Cardiovascular:  Negative for chest pain.  Gastrointestinal:  Negative for abdominal pain, diarrhea, nausea and vomiting.  Neurological:  Negative for dizziness, light-headedness and headaches.    Physical Exam Triage Vital Signs ED Triage Vitals  Enc Vitals Group     BP 01/01/21 1208 (!) 157/109     Pulse Rate 01/01/21 1208 69     Resp 01/01/21 1208 18     Temp 01/01/21 1208 98.2 F (36.8 C)     Temp Source 01/01/21 1208 Oral     SpO2 01/01/21 1208 95 %     Weight --      Height --      Head Circumference --      Peak Flow --      Pain Score 01/01/21 1211 5  Pain Loc --      Pain Edu? --      Excl. in GC? --    No data found.  Updated Vital Signs BP (!) 157/109 (BP Location: Left Arm)   Pulse 69   Temp 98.2 F (36.8 C) (Oral)   Resp 18   SpO2 95%   Visual Acuity Right Eye Distance:   Left Eye Distance:   Bilateral Distance:    Right Eye Near:   Left Eye Near:    Bilateral Near:     Physical Exam Vitals reviewed.  Constitutional:      General: She is awake. She is not in acute distress.    Appearance: Normal appearance. She is well-developed. She is not ill-appearing.     Comments: Very pleasant female appears stated age in no acute distress sitting comfortably in exam room  HENT:     Head: Normocephalic and atraumatic.     Right Ear: Ear canal and external ear normal. A middle ear effusion is present. Tympanic membrane is not erythematous or bulging.     Left Ear: Ear  canal and external ear normal. A middle ear effusion is present. Tympanic membrane is not erythematous or bulging.     Nose:     Right Sinus: No maxillary sinus tenderness or frontal sinus tenderness.     Left Sinus: No maxillary sinus tenderness or frontal sinus tenderness.     Mouth/Throat:     Pharynx: Uvula midline. No oropharyngeal exudate or posterior oropharyngeal erythema.     Comments: Drainage present posterior oropharynx Cardiovascular:     Rate and Rhythm: Normal rate and regular rhythm.     Heart sounds: Normal heart sounds, S1 normal and S2 normal. No murmur heard. Pulmonary:     Effort: Pulmonary effort is normal.     Breath sounds: Normal breath sounds. No wheezing, rhonchi or rales.     Comments: Clear to auscultation bilaterally Psychiatric:        Behavior: Behavior is cooperative.     UC Treatments / Results  Labs (all labs ordered are listed, but only abnormal results are displayed) Labs Reviewed - No data to display  EKG   Radiology No results found.  Procedures Procedures (including critical care time)  Medications Ordered in UC Medications  methylPREDNISolone acetate (DEPO-MEDROL) injection 60 mg (has no administration in time range)    Initial Impression / Assessment and Plan / UC Course  I have reviewed the triage vital signs and the nursing notes.  Pertinent labs & imaging results that were available during my care of the patient were reviewed by me and considered in my medical decision making (see chart for details).     No evidence of acute infection that warrant initiation of antibiotics.  Viral testing was deferred given recurrent episodes of similar symptoms over the past several months.  Suspect seasonal allergies that are uncontrolled as etiology of symptoms.  Patient was given Depo-Medrol in clinic today we discussed the importance of regular allergy medication regimen.  She will continue using Flonase as prescribed and will add Xyzal at  night.  Discussed that if symptoms persist she may benefit from seeing ENT and/or allergist.  She was given contact information for ENT and encouraged to follow-up with them if symptoms do not improve with medication regimen.  Recommended she rest and drink plenty of fluid.  Discussed alarm symptoms that warrant emergent evaluation.  Strict return precautions given to which she expressed understanding.  Final Clinical Impressions(s) /  UC Diagnoses   Final diagnoses:  Seasonal allergies  Nasal congestion  Throat irritation     Discharge Instructions      We gave you an injection of steroids today.  Please continue doing Flonase as prescribed previously.  Start Xyzal at night to help with allergy symptoms.  I recommend you gargle with warm salt water and use sinus rinses.  If your symptoms not improving please follow-up with ENT for further evaluation and management.  If you have any sudden worsening of symptoms including fever, cough, nausea, vomiting, chest pain, shortness of breath you need to be seen immediately.     ED Prescriptions     Medication Sig Dispense Auth. Provider   levocetirizine (XYZAL ALLERGY 24HR) 5 MG tablet Take 1 tablet (5 mg total) by mouth every evening. 30 tablet Kolsen Choe, Noberto Retort, PA-C      PDMP not reviewed this encounter.   Jeani Hawking, PA-C 01/01/21 1228

## 2021-01-01 NOTE — ED Triage Notes (Signed)
4 day h/o cough, congestion and clogged bilateral ears. Pt reports at the onset she had a sore throat that has since resolved. Has been taking nyquil, mucinex and nasal sprays with some relief.

## 2021-01-01 NOTE — Discharge Instructions (Signed)
We gave you an injection of steroids today.  Please continue doing Flonase as prescribed previously.  Start Xyzal at night to help with allergy symptoms.  I recommend you gargle with warm salt water and use sinus rinses.  If your symptoms not improving please follow-up with ENT for further evaluation and management.  If you have any sudden worsening of symptoms including fever, cough, nausea, vomiting, chest pain, shortness of breath you need to be seen immediately.

## 2021-01-26 NOTE — Progress Notes (Signed)
Erroneous encounter

## 2021-01-29 ENCOUNTER — Encounter: Payer: Self-pay | Admitting: Family

## 2021-01-29 DIAGNOSIS — Z7689 Persons encountering health services in other specified circumstances: Secondary | ICD-10-CM

## 2021-02-05 ENCOUNTER — Other Ambulatory Visit: Payer: Self-pay

## 2021-02-05 ENCOUNTER — Encounter: Payer: Self-pay | Admitting: Emergency Medicine

## 2021-02-05 ENCOUNTER — Ambulatory Visit
Admission: EM | Admit: 2021-02-05 | Discharge: 2021-02-05 | Disposition: A | Discharge status: Home / Self Care | Payer: Medicaid Other | Attending: Physician Assistant | Admitting: Physician Assistant

## 2021-02-05 DIAGNOSIS — J069 Acute upper respiratory infection, unspecified: Secondary | ICD-10-CM

## 2021-02-05 MED ORDER — OSELTAMIVIR PHOSPHATE 75 MG PO CAPS: 75.0000 mg | ORAL_CAPSULE | Freq: Two times a day (BID) | ORAL | 0 refills | Status: DC

## 2021-02-05 NOTE — ED Triage Notes (Signed)
Last night began having a fever, nausea, vomiting, diarrhea, and body aches. Son recently got over having the flu

## 2021-02-05 NOTE — ED Provider Notes (Signed)
[name]    CSN: [id number] Arrival date & time: 02/05/21  0800      History   Chief Complaint Chief Complaint  Patient presents with   Influenza    HPI Tiffany Weiss is a 35 y.o. female.   Patient here today for evaluation of fever, nausea, vomiting and diarrhea that started last night.  She reports she is also developed body aches.  She is concerned because her husband recently had the flu.  She denies any symptomatic treatment for symptoms.  The history is provided by the patient.  Influenza Presenting symptoms: cough, diarrhea, fever, myalgias, nausea and vomiting   Presenting symptoms: no shortness of breath and no sore throat   Associated symptoms: nasal congestion   Associated symptoms: no ear pain    Past Medical History:  Diagnosis Date   Vitamin D deficiency disease     There are no problems to display for this patient.   Past Surgical History:  Procedure Laterality Date   CESAREAN SECTION     TUBAL LIGATION      OB History   No obstetric history on file.      Home Medications    Prior to Admission medications   Medication Sig Start Date End Date Taking? Authorizing Provider  oseltamivir (TAMIFLU) 75 MG capsule Take 1 capsule (75 mg total) by mouth every 12 (twelve) hours. 02/05/21  Yes Tomi Bamberger, PA-C  fluticasone [facility]) 50 MCG/ACT nasal spray Place 2 sprays into both nostrils daily. 11/30/20   Rushie Chestnut, PA-C  levocetirizine (XYZAL ALLERGY 24HR) 5 MG tablet Take 1 tablet (5 mg total) by mouth every evening. 01/01/21   Raspet, Noberto Retort, PA-C    Family History Family History  Problem Relation Age of Onset   Heart failure Mother    Diabetes Mother    Hypertension Mother     Social History Social History   Tobacco Use   Smoking status: Every Day    Packs/day: 0.40    Types: Cigarettes   Smokeless tobacco: Never  Substance Use Topics   Alcohol use: Yes   Drug use: No     Allergies   Patient has  no known allergies.   Review of Systems Review of Systems  Constitutional:  Positive for fever.  HENT:  Positive for congestion. Negative for ear pain and sore throat.   Eyes:  Negative for discharge and redness.  Respiratory:  Positive for cough. Negative for shortness of breath and wheezing.   Gastrointestinal:  Positive for diarrhea, nausea and vomiting. Negative for abdominal pain.  Musculoskeletal:  Positive for myalgias.    Physical Exam Triage Vital Signs ED Triage Vitals  Enc Vitals Group     BP 02/05/21 0809 134/85     Pulse Rate 02/05/21 0809 (!) 112     Resp 02/05/21 0809 16     Temp 02/05/21 0809 98.7 F (37.1 C)     Temp Source 02/05/21 0809 Oral     SpO2 02/05/21 0809 98 %     Weight --      Height --      Head Circumference --      Peak Flow --      Pain Score 02/05/21 0811 6     Pain Loc --      Pain Edu? --      Excl. in GC? --    No data found.  Updated Vital Signs BP 134/85 (BP Location: Left Arm)  Pulse (!) 112    Temp 98.7 F (37.1 C) (Oral)    Resp 16    SpO2 98%      Physical Exam Vitals and nursing note reviewed.  Constitutional:      General: She is not in acute distress.    Appearance: Normal appearance. She is not ill-appearing.  HENT:     Head: Normocephalic and atraumatic.     Nose: Congestion present.  Eyes:     Conjunctiva/sclera: Conjunctivae normal.  Cardiovascular:     Rate and Rhythm: Normal rate and regular rhythm.     Heart sounds: Normal heart sounds. No murmur heard. Pulmonary:     Effort: Pulmonary effort is normal. No respiratory distress.     Breath sounds: Normal breath sounds. No wheezing, rhonchi or rales.  Skin:    General: Skin is warm and dry.  Neurological:     Mental Status: She is alert.  Psychiatric:        Mood and Affect: Mood normal.        Thought Content: Thought content normal.     UC Treatments / Results  Labs (all labs ordered are listed, but only abnormal results are displayed) Labs  Reviewed  COVID-19, FLU A+B NAA    EKG   Radiology No results found.  Procedures Procedures (including critical care time)  Medications Ordered in UC Medications - No data to display  Initial Impression / Assessment and Plan / UC Course  I have reviewed the triage vital signs and the nursing notes.  Pertinent labs & imaging results that were available during my care of the patient were reviewed by me and considered in my medical decision making (see chart for details).  Given known exposures to suspect symptoms are related to influenza, will screen for COVID and flu and Tamiflu prescribed.  Recommended symptomatic treatment otherwise, increase fluids and rest.  Encouraged follow-up with any further concerns.  Final Clinical Impressions(s) / UC Diagnoses   Final diagnoses:  Acute upper respiratory infection   Discharge Instructions   None    ED Prescriptions     Medication Sig Dispense Auth. Provider   oseltamivir (TAMIFLU) 75 MG capsule Take 1 capsule (75 mg total) by mouth every 12 (twelve) hours. 10 capsule Tomi Bamberger, PA-C      PDMP not reviewed this encounter.   Tomi Bamberger, PA-C 02/05/21 [phone]

## 2021-02-06 LAB — COVID-19, FLU A+B NAA
Influenza A, NAA: NOT DETECTED
Influenza B, NAA: NOT DETECTED
SARS-CoV-2, NAA: NOT DETECTED

## 2021-05-10 DIAGNOSIS — E669 Obesity, unspecified: Secondary | ICD-10-CM | POA: Insufficient documentation

## 2021-05-10 DIAGNOSIS — F39 Unspecified mood [affective] disorder: Secondary | ICD-10-CM | POA: Insufficient documentation

## 2021-06-05 ENCOUNTER — Other Ambulatory Visit: Payer: Self-pay | Admitting: Family Medicine

## 2021-06-05 DIAGNOSIS — N6320 Unspecified lump in the left breast, unspecified quadrant: Secondary | ICD-10-CM

## 2021-06-18 ENCOUNTER — Ambulatory Visit
Admission: RE | Admit: 2021-06-18 | Discharge: 2021-06-18 | Disposition: A | Discharge status: Home / Self Care | Payer: Medicaid Other | Source: Ambulatory Visit | Attending: Family Medicine | Admitting: Family Medicine

## 2021-06-18 ENCOUNTER — Other Ambulatory Visit: Payer: Self-pay | Admitting: Family Medicine

## 2021-06-18 DIAGNOSIS — N6489 Other specified disorders of breast: Secondary | ICD-10-CM

## 2021-06-18 DIAGNOSIS — N6320 Unspecified lump in the left breast, unspecified quadrant: Secondary | ICD-10-CM

## 2021-07-03 ENCOUNTER — Encounter: Payer: Self-pay | Admitting: *Deleted

## 2021-07-19 DIAGNOSIS — I10 Essential (primary) hypertension: Secondary | ICD-10-CM | POA: Insufficient documentation

## 2021-07-24 ENCOUNTER — Ambulatory Visit (INDEPENDENT_AMBULATORY_CARE_PROVIDER_SITE_OTHER): Payer: Medicaid Other | Admitting: Podiatry

## 2021-07-24 DIAGNOSIS — M216X1 Other acquired deformities of right foot: Secondary | ICD-10-CM | POA: Diagnosis not present

## 2021-07-25 NOTE — Progress Notes (Signed)
  Subjective:  Patient ID: Tiffany Weiss, female    DOB: 1985-08-30,  MRN: [id number]  Chief Complaint  Patient presents with   Callouses    36 y.o. female presents with the above complaint.  Patient presents with complaint of right submetatarsal 4 porokeratotic lesion she has underlying plantarflexed fourth metatarsal.  Patient states pain for touch is progressive gotten worse.  She has not seen anyone else prior to seeing me.  She would like to discuss treatment options for it.  She is a diabetic with last A1c of that is unknown.  She states she would like to have it debrided down.  She stands and walks a lot on her foot.  She states that they have been painful.  She denies any other acute complaints.  She has not seen anyone to see me for this.   Review of Systems: Negative except as noted in the HPI. Denies N/V/F/Ch.  Past Medical History:  Diagnosis Date   Vitamin D deficiency disease     Current Outpatient Medications:    fluticasone (FLONASE) 50 MCG/ACT nasal spray, Place 2 sprays into both nostrils daily., Disp: 16 mL, Rfl: 0   levocetirizine (XYZAL ALLERGY 24HR) 5 MG tablet, Take 1 tablet (5 mg total) by mouth every evening., Disp: 30 tablet, Rfl: 0   oseltamivir (TAMIFLU) 75 MG capsule, Take 1 capsule (75 mg total) by mouth every 12 (twelve) hours., Disp: 10 capsule, Rfl: 0  Social History   Tobacco Use  Smoking Status Every Day   Packs/day: 0.40   Types: Cigarettes  Smokeless Tobacco Never    No Known Allergies Objective:  There were no vitals filed for this visit. There is no height or weight on file to calculate BMI. Constitutional Well developed. Well nourished.  Vascular Dorsalis pedis pulses palpable bilaterally. Posterior tibial pulses palpable bilaterally. Capillary refill normal to all digits.  No cyanosis or clubbing noted. Pedal hair growth normal.  Neurologic Normal speech. Oriented to person, place, and time. Epicritic sensation to light touch  grossly present bilaterally.  Dermatologic Hyperkeratotic lesion with central nucleated core noted to the right submetatarsal 4.  Is consistent with porokeratosis/skin neoplasm.  Patient also has underlying plantarflexed fourth metatarsal.  Orthopedic: Normal joint ROM without pain or crepitus bilaterally. No visible deformities. No bony tenderness.   Radiographs: None Assessment:   1. Plantar flexed metatarsal, right    Plan:  Patient was evaluated and treated and all questions answered.  Right fourth plantarflexed metatarsal with underlying porokeratosis submet 4 -All questions and concerns were discussed with the patient in extensive detail.  Given the amount of pain that she is experiencing I believe she will benefit from aggressive debridement of the lesion.  Using chisel blade handle the lesion was debrided down to healthy striated tissue.  No complication noted no pinpoint bleeding noted -  No follow-ups on file.

## 2021-09-01 DIAGNOSIS — Z720 Tobacco use: Secondary | ICD-10-CM | POA: Insufficient documentation

## 2021-09-06 ENCOUNTER — Encounter: Payer: Medicaid Other | Admitting: Obstetrics and Gynecology

## 2022-08-04 ENCOUNTER — Other Ambulatory Visit: Payer: Self-pay | Admitting: Family Medicine

## 2022-08-04 ENCOUNTER — Other Ambulatory Visit: Payer: Self-pay

## 2022-08-04 DIAGNOSIS — N920 Excessive and frequent menstruation with regular cycle: Secondary | ICD-10-CM

## 2022-08-04 DIAGNOSIS — N941 Unspecified dyspareunia: Secondary | ICD-10-CM

## 2022-08-04 DIAGNOSIS — N946 Dysmenorrhea, unspecified: Secondary | ICD-10-CM

## 2022-08-07 ENCOUNTER — Other Ambulatory Visit: Payer: Medicaid Other

## 2022-08-15 ENCOUNTER — Ambulatory Visit
Admission: RE | Admit: 2022-08-15 | Discharge: 2022-08-15 | Disposition: A | Discharge status: Home / Self Care | Payer: Medicaid Other | Source: Ambulatory Visit | Attending: Family Medicine | Admitting: Family Medicine

## 2022-08-15 DIAGNOSIS — N946 Dysmenorrhea, unspecified: Secondary | ICD-10-CM

## 2022-08-15 DIAGNOSIS — N941 Unspecified dyspareunia: Secondary | ICD-10-CM

## 2022-08-15 DIAGNOSIS — N920 Excessive and frequent menstruation with regular cycle: Secondary | ICD-10-CM

## 2022-10-23 ENCOUNTER — Encounter: Payer: Medicaid Other | Admitting: Obstetrics and Gynecology

## 2022-11-20 ENCOUNTER — Encounter: Payer: Self-pay | Admitting: Obstetrics and Gynecology

## 2022-11-20 ENCOUNTER — Other Ambulatory Visit (HOSPITAL_COMMUNITY)
Admission: RE | Admit: 2022-11-20 | Discharge: 2022-11-20 | Disposition: A | Discharge status: Home / Self Care | Payer: Medicaid Other | Source: Ambulatory Visit | Attending: Obstetrics and Gynecology | Admitting: Obstetrics and Gynecology

## 2022-11-20 ENCOUNTER — Ambulatory Visit: Payer: Medicaid Other | Admitting: Obstetrics and Gynecology

## 2022-11-20 VITALS — BP 175/103 | HR 81 | Ht 63.0 in | Wt 182.1 lb

## 2022-11-20 DIAGNOSIS — N920 Excessive and frequent menstruation with regular cycle: Secondary | ICD-10-CM

## 2022-11-20 NOTE — Progress Notes (Signed)
  CC: heavy periods Subjective:    Patient ID: Tiffany Weiss, female    DOB: 1985-11-18, 37 y.o.   MRN: [id number]  HPI 37 yo [id number], c/s x 2, seen for discussion of heavy menses.  Menses are fairly regular, but last 5-7 days.  Pt has to use 3-5 tampons/hour along with pads.  She has passed clots/tissue as well.  Recent ultrasound was normal.   Review of Systems     Objective:   Physical Exam Vitals:   11/20/22 1441 11/20/22 1456  BP: (!) 173/102 (!) 175/103  Pulse: 90 81  CLINICAL DATA:  Frequent menstruation   EXAM: TRANSABDOMINAL AND TRANSVAGINAL ULTRASOUND OF PELVIS   TECHNIQUE: Both transabdominal and transvaginal ultrasound examinations of the pelvis were performed. Transabdominal technique was performed for global imaging of the pelvis including uterus, ovaries, adnexal regions, and pelvic cul-de-sac. It was necessary to proceed with endovaginal exam following the transabdominal exam to visualize the uterus endometrium ovaries.   COMPARISON:  None Available.   FINDINGS: Uterus   Measurements: 9.8 x 3.8 x 4.6 cm = volume: 89.3 mL. Large cervical cyst.   Endometrium   Thickness: 7.7 mm.  Trace fluid in the fundal endometrial canal   Right ovary   Measurements: 2.7 x 1.1 x 2 cm = volume: 3.2 mL. Normal appearance/no adnexal mass.   Left ovary   Not visualized.   Other findings   Small free fluid   IMPRESSION: 1. Nonvisualized left ovary. 2. Small free fluid in the pelvis. 3. Trace fluid in the fundal endometrial canal. Endometrial thickness of 7.7 mm. If bleeding remains unresponsive to hormonal or medical therapy, sonohysterogram should be considered for focal lesion work-up. (Ref: Radiological Reasoning: Algorithmic Workup of Abnormal Vaginal Bleeding with Endovaginal Sonography and Sonohysterography. AJR 2008; 130:Q65-78)       Assessment & Plan:   1. Menorrhagia with regular cycle Endometrial biopsy taken today, see separate note. Pt  is not a candidate for hormonal therapy due to uncontrolled blood pressure.  Treatment options: Expectant management Lysteda (non hormonal) High dose NSAIDs at beginning of cycle Progesterone IUD Uterine ablation hysterectomy - Surgical pathology( [place]/ [facility])  Pt will contact MD regarding her choice for trial of management. Per pt she has had pap this year with her PCP, cannot extract results at this time.  Warden Fillers, MD Faculty Attending, [facility]

## 2022-11-20 NOTE — Progress Notes (Signed)
New GYN is in the office to establish care. Pt reports that PCP prescribed BP meds but she does not want to take it because she wants to do natural remedies. BP today is 173/102, pt reports blurry vision at times.  Reports LMP 10/21/22, cycles lasts between 5-7 days, wears pads, tampons, diapers, and changes 3-4 times an hour. Pt states that u/s was performed on 08/15/22 Last pap 05/2021

## 2022-11-20 NOTE — Progress Notes (Signed)
ENDOMETRIAL BIOPSY      Tiffany Weiss is a 37 y.o. [id number] here for endometrial biopsy.  The indications for endometrial biopsy were reviewed.  Risks of the biopsy including cramping, bleeding, infection, uterine perforation, inadequate specimen and need for additional procedures were discussed. The patient states she understands and agrees to undergo procedure today. Consent was signed. Time out was performed.   Indications: menorrhagia Urine HCG: pt s/p BTL  A bivalve speculum was placed into the vagina and the cervix was easily visualized and was prepped with Betadine x2. A single-toothed tenaculum was placed on the anterior lip of the cervix to stabilize it. The 3 mm pipelle was introduced into the endometrial cavity without difficulty to a depth of 10 cm, and a moderate amount of tissue was obtained and sent to pathology. This was repeated for a total of 3 passes. The instruments were removed from the patient's vagina. Minimal bleeding from the cervix at the tenaculum was noted.   The patient tolerated the procedure well. Routine post-procedure instructions were given to the patient.    Will base further management on results of biopsy.  Mariel Aloe, MD

## 2022-11-24 LAB — SURGICAL PATHOLOGY

## 2023-02-02 ENCOUNTER — Encounter: Payer: Medicaid Other | Admitting: Family Medicine

## 2023-02-23 ENCOUNTER — Encounter (INDEPENDENT_AMBULATORY_CARE_PROVIDER_SITE_OTHER): Payer: Medicaid Other | Admitting: Adult Health

## 2023-03-12 ENCOUNTER — Encounter: Payer: Medicaid Other | Admitting: Family Medicine

## 2023-04-16 ENCOUNTER — Encounter: Payer: Medicaid Other | Admitting: Family Medicine

## 2023-05-21 ENCOUNTER — Encounter: Payer: Self-pay | Admitting: Family Medicine

## 2023-05-21 ENCOUNTER — Ambulatory Visit: Payer: Medicaid Other | Admitting: Family Medicine

## 2023-05-21 VITALS — BP 137/83 | HR 76 | Temp 98.8°F | Ht 63.0 in | Wt 177.0 lb

## 2023-05-21 DIAGNOSIS — R7303 Prediabetes: Secondary | ICD-10-CM | POA: Diagnosis not present

## 2023-05-21 DIAGNOSIS — I10 Essential (primary) hypertension: Secondary | ICD-10-CM

## 2023-05-21 DIAGNOSIS — E66811 Obesity, class 1: Secondary | ICD-10-CM | POA: Diagnosis not present

## 2023-05-21 DIAGNOSIS — Z0289 Encounter for other administrative examinations: Secondary | ICD-10-CM

## 2023-05-21 DIAGNOSIS — Z6831 Body mass index (BMI) 31.0-31.9, adult: Secondary | ICD-10-CM

## 2023-05-21 NOTE — Progress Notes (Signed)
 Office: [phone]  /  Fax: [phone]   Initial Visit  Tiffany Weiss was seen in clinic today to evaluate for obesity. She is interested in losing weight to improve overall health and reduce the risk of weight related complications. She presents today to review program treatment options, initial physical assessment, and evaluation.     She was referred by: PCP  When asked what else they would like to accomplish? She states: Improve energy levels and physical activity, Improve existing medical conditions, and Improve appearance  Weight history: has struggled more with weight gain over the past few years.  She was last at her goal weight 15 years (20, 17, 21),  works from home, scheduling (sedentary)  When asked how has your weight affected you? She states: Contributed to medical problems, Having fatigue, and Problems with eating patterns  Some associated conditions: Hypertension, Hyperlipidemia, and Prediabetes  Contributing factors: Family history of obesity, Moderate to high levels of stress, Reduced physical activity, and Eating patterns  Weight promoting medications identified: None  Current nutrition plan: None  Current level of physical activity: NEAT  Current or previous pharmacotherapy: None  Response to medication: Never tried medications   Past medical history includes:   Past Medical History:  Diagnosis Date   Hypertension    Vitamin D deficiency disease      Objective:   BP 137/83   Pulse 76   Temp 98.8 F (37.1 C)   Ht 5\' 3"  (1.6 m)   Wt 177 lb (80.3 kg)   SpO2 99%   BMI 31.35 kg/m  She was weighed on the bioimpedance scale: Body mass index is 31.35 kg/m.  Peak Weight:177 , Body Fat%:36.2, Visceral Fat Rating:7, Weight trend over the last 12 months: Increasing  General:  Alert, oriented and cooperative. Patient is in no acute distress.  Respiratory: Normal respiratory effort, no problems with respiration noted   Gait: able to ambulate  independently  Mental Status: Normal mood and affect. Normal behavior. Normal judgment and thought content.   DIAGNOSTIC DATA REVIEWED:  BMET    Component Value Date/Time   NA 137 03/27/2016 0846   K 4.1 03/27/2016 0846   CL 102 03/27/2016 0846   CO2 25 03/27/2016 0846   GLUCOSE 97 03/27/2016 0846   BUN 6 03/27/2016 0846   CREATININE 0.88 03/27/2016 0846   CALCIUM 8.6 (L) 03/27/2016 0846   GFRNONAA >60 03/27/2016 0846   GFRAA >60 03/27/2016 0846   No results found for: "HGBA1C" No results found for: "INSULIN" CBC    Component Value Date/Time   WBC 7.5 03/27/2016 0846   RBC 4.00 03/27/2016 0846   HGB 12.1 03/27/2016 0846   HCT 37.5 03/27/2016 0846   PLT 331 03/27/2016 0846   MCV 93.8 03/27/2016 0846   MCH 30.3 03/27/2016 0846   MCHC 32.3 03/27/2016 0846   RDW 16.1 (H) 03/27/2016 0846   Iron/TIBC/Ferritin/ %Sat No results found for: "IRON", "TIBC", "FERRITIN", "IRONPCTSAT" Lipid Panel  No results found for: "CHOL", "TRIG", "HDL", "CHOLHDL", "VLDL", "LDLCALC", "LDLDIRECT" Hepatic Function Panel     Component Value Date/Time   PROT 5.7 (L) 03/27/2016 0846   ALBUMIN 3.2 (L) 03/27/2016 0846   AST 22 03/27/2016 0846   ALT 22 03/27/2016 0846   ALKPHOS 53 03/27/2016 0846   BILITOT 0.1 (L) 03/27/2016 0846   No results found for: "TSH"   Assessment and Plan:   Primary hypertension BP is controlled on hydralazine 25 mg daily + hydrochlorothiazide 25 mg daily  Look  for BP improvements with weight loss  Class 1 obesity due to excess calories with serious comorbidity and body mass index (BMI) of 31.0 to 31.9 in adult  Prediabetes No results found for: "HGBA1C" A1c reviewed under care everywhere, 5.8 on 08/04/22  Repeat A1c and fasting insulin next visit       Obesity Treatment / Action Plan:  Patient will work on garnering support from family and friends to begin weight loss journey. Will work on eliminating or reducing the presence of highly palatable, calorie  dense foods in the home. Will complete provided nutritional and psychosocial assessment questionnaire before the next appointment. Will be scheduled for indirect calorimetry to determine resting energy expenditure in a fasting state.  This will allow [place] to create a reduced calorie, high-protein meal plan to promote loss of fat mass while preserving muscle mass. Will think about ideas on how to incorporate physical activity into their daily routine. Counseled on the health benefits of losing 5%-15% of total body weight. Was counseled on nutritional approaches to weight loss and benefits of reducing processed foods and consuming plant-based foods and high quality protein as part of nutritional weight management. Was counseled on pharmacotherapy and role as an adjunct in weight management.   Obesity Education Performed Today:  She was weighed on the bioimpedance scale and results were discussed and documented in the synopsis.  We discussed obesity as a disease and the importance of a more detailed evaluation of all the factors contributing to the disease.  We discussed the importance of long term lifestyle changes which include nutrition, exercise and behavioral modifications as well as the importance of customizing this to her specific health and social needs.  We discussed the benefits of reaching a healthier weight to alleviate the symptoms of existing conditions and reduce the risks of the biomechanical, metabolic and psychological effects of obesity.  Tiffany Weiss appears to be in the action stage of change and states they are ready to start intensive lifestyle modifications and behavioral modifications.  20 minutes was spent today on this visit including the above counseling, pre-visit chart review, and post-visit documentation.  Reviewed by clinician on day of visit: allergies, medications, problem list, medical history, surgical history, family history, social history, and previous  encounter notes pertinent to obesity diagnosis.    Seymour Bars, D.O. DABFM, [facility] [address] [place], [place] [address] [phone]

## 2023-06-18 ENCOUNTER — Ambulatory Visit (INDEPENDENT_AMBULATORY_CARE_PROVIDER_SITE_OTHER): Admitting: Family Medicine

## 2023-06-18 ENCOUNTER — Encounter: Payer: Self-pay | Admitting: Family Medicine

## 2023-06-18 VITALS — BP 143/90 | HR 68 | Temp 97.9°F | Ht 63.0 in | Wt 177.0 lb

## 2023-06-18 DIAGNOSIS — R0602 Shortness of breath: Secondary | ICD-10-CM | POA: Diagnosis not present

## 2023-06-18 DIAGNOSIS — Z1331 Encounter for screening for depression: Secondary | ICD-10-CM | POA: Diagnosis not present

## 2023-06-18 DIAGNOSIS — R7303 Prediabetes: Secondary | ICD-10-CM | POA: Diagnosis not present

## 2023-06-18 DIAGNOSIS — E782 Mixed hyperlipidemia: Secondary | ICD-10-CM

## 2023-06-18 DIAGNOSIS — I10 Essential (primary) hypertension: Secondary | ICD-10-CM | POA: Diagnosis not present

## 2023-06-18 DIAGNOSIS — R5383 Other fatigue: Secondary | ICD-10-CM | POA: Diagnosis not present

## 2023-06-18 DIAGNOSIS — R0683 Snoring: Secondary | ICD-10-CM

## 2023-06-18 DIAGNOSIS — E66811 Obesity, class 1: Secondary | ICD-10-CM

## 2023-06-18 DIAGNOSIS — D500 Iron deficiency anemia secondary to blood loss (chronic): Secondary | ICD-10-CM

## 2023-06-18 DIAGNOSIS — Z6831 Body mass index (BMI) 31.0-31.9, adult: Secondary | ICD-10-CM

## 2023-06-18 NOTE — Progress Notes (Signed)
 At a Glance:  Vitals Temp: 97.9 F (36.6 C) BP: (!) 143/90 Pulse Rate: 68 SpO2: 100 %   Anthropometric Measurements Height: 5\' 3"  (1.6 m) Weight: 177 lb (80.3 kg) BMI (Calculated): 31.36 Starting Weight: 177lb Peak Weight: 177lb   Body Composition  Body Fat %: 35.7 % Fat Mass (lbs): 63.4 lbs Muscle Mass (lbs): 108.6 lbs Total Body Water (lbs): 68.8 lbs Visceral Fat Rating : 7   Other Clinical Data RMR: 1742 Fasting: Yes Labs: Yes Today's Visit #: 1 Starting Date: 06/18/23    EKG: Normal sinus rhythm, rate 68.  Consecutively T wave inversion  Indirect Calorimeter completed today shows a VO2 of 252 and a REE of 1742.  Her calculated basal metabolic rate is 1610 thus her basal metabolic rate is better than expected.  Chief Complaint:  Obesity   Subjective:  Tiffany Weiss (MR# [id number]) is a 38 y.o. female who presents for evaluation and treatment of obesity and related comorbidities.   Tiffany Weiss is currently in the action stage of change and ready to dedicate time achieving and maintaining a healthier weight. Tiffany Weiss is interested in becoming our patient and working on intensive lifestyle modifications including (but not limited to) diet and exercise for weight loss.  Tiffany Weiss has been struggling with her weight. She has been unsuccessful in either losing weight, maintaining weight loss, or reaching her healthy weight goal.  She would like to get down to 140 pounds.  She has had 5 children.  She is a single mom and lives at home with 2 of her daughters, her son and her 37-year-old grandson.  She works from home and is quite sedentary.  Tiffany Weiss's habits were reviewed today and are as follows: Her family eats meals together, she wakes up frequently in the middle of the night to eat, she skips meals frequently, she is frequently drinking liquids with calories, and she frequently makes poor food choices.  She likes to cook.  She prefers savory over sweet foods.  She  snacks more late at night.  She complains of financial strain and does some reward eating.  She has alcohol 1-2 drinks per week.  She has never used antiobesity medications.  She is a current smoker.   Other Fatigue Tiffany Weiss admits to daytime somnolence and admits to waking up still tired. Patient has a history of symptoms of daytime fatigue. Tiffany Weiss generally gets 3 or 4 hours of sleep per night, and states that she has difficulty falling asleep and nightime awakenings. Snoring is present. Apneic episodes are present. Epworth Sleepiness Score is 3.   Shortness of Breath Tiffany Weiss notes increasing shortness of breath with exercising and seems to be worsening over time with weight gain. She notes getting out of breath sooner with activity than she used to. This has gotten worse recently. Tiffany Weiss denies shortness of breath at rest or orthopnea.   Depression Screen Tiffany Weiss's Food and Mood (modified PHQ-9) score was 12.      No data to display           Assessment and Plan:   Other Fatigue Tiffany Weiss does feel that her weight is causing her energy to be lower than it should be. Fatigue may be related to obesity, depression or many other causes. Labs will be ordered, and in the meanwhile, Tiffany Weiss will focus on self care including making healthy food choices, increasing physical activity and focusing on stress reduction.  Shortness of Breath Tiffany Weiss does feel that she gets out of breath more easily  that she used to when she exercises. Tiffany Weiss's shortness of breath appears to be obesity related and exercise induced. She has agreed to work on weight loss and gradually increase exercise to treat her exercise induced shortness of breath. Will continue to monitor closely.  Tiffany Weiss had a positive depression screening. Depression is commonly associated with obesity and often results in emotional eating behaviors. We will monitor this closely and work on CBT to help improve the non-hunger eating patterns.  Referral to Psychology may be required if no improvement is seen as she continues in our clinic.    Problem List Items Addressed This Visit     Primary hypertension Blood pressure is elevated.  She reports her blood pressure has remained elevated even at her PCP visit.  She is currently on hydralazine 25 mg 3 times daily, losartan/HCTZ 50/12.5 mg daily  Given the findings of her T wave inversion on consecutive leads on her EKG, her current smoking status and poorly controlled hypertension, will likely refer to cardiology next visit if her blood pressure remains elevated.  She is asymptomatic without dyspnea on exertion or chest pain.   Relevant Medications   losartan-hydrochlorothiazide (HYZAAR) 50-12.5 MG tablet   Snoring She reports poor quality sleep at night averaging 3 to 4 hours a night.  She has never had a sleep study but she does snore heavily.  Her Epworth score is low at 3.  Consider sleep study.  Work on improving sleep time to 7 to 8 hours at night.  We discussed the importance of adequate sleep and weight management.    Prediabetes She reports a history of prediabetes.  She is on metformin 500 mg daily and tolerating this well.  Her diet is currently high in added sugar and refined carbohydrates.  She does no regular exercise.   Relevant Orders   Insulin, random   Hemoglobin A1c   Other Visit Diagnoses       SOBOE (shortness of breath on exertion)    -  Primary     Other fatigue       Relevant Orders   EKG 12-Lead (Completed)   VITAMIN D 25 Hydroxy (Vit-D Deficiency, Fractures)   TSH   T4, free   T3   Folate   Comprehensive metabolic panel with GFR   Vitamin B12   CBC with Differential/Platelet     Mixed hyperlipidemia     She is currently not on any cholesterol-lowering medication but her LDL has been elevated.  Repeat today and begin prescribed dietary plan which is low in saturated fat.   Relevant Medications   losartan-hydrochlorothiazide (HYZAAR) 50-12.5 MG  tablet   Other Relevant Orders   Lipid panel     Iron deficiency anemia due to chronic blood loss     She reports a history of prolonged menses lasting 7+ days monthly.  She is not on birth control.  She has required oral iron and does take this on a fairly consistent basis, tolerating well.  Check iron levels with labs today.   Relevant Orders   Ferritin   Iron and TIBC     Class 1 obesity with alveolar hypoventilation, serious comorbidity, and body mass index (BMI) of 31.0 to 31.9 in adult Tiffany Weiss)           Zalaya is currently in the action stage of change and her goal is to continue with weight loss efforts. I recommend Tiffany Weiss begin the structured treatment plan as follows:  She has agreed to Category  3 Plan - 100 of the snack cal (1400 cal/ day)  Exercise goals: All adults should avoid inactivity. Some activity is better than none, and adults who participate in any amount of physical activity, gain some health benefits.  Behavioral modification strategies:increasing lean protein intake, decreasing simple carbohydrates, increasing vegetables, increase H2O intake, decrease liquid calories, decrease ETOH, increase high fiber foods, decreasing eating out, no skipping meals, meal planning and cooking strategies, keeping healthy foods in the home, better snacking choices, planning for success, and decrease junk food   She was informed of the importance of frequent follow-up visits to maximize her success with intensive lifestyle modifications for her multiple health conditions. She was informed we would discuss her lab results at her next visit unless there is a critical issue that needs to be addressed sooner. Tiffany Weiss agreed to keep her next visit at the agreed upon time to discuss these results.  Objective:  General: Cooperative, alert, well developed, in no acute distress. HEENT: Conjunctivae and lids unremarkable. Cardiovascular: Regular rhythm.  Lungs: Normal work of  breathing. Neurologic: No focal deficits.   Lab Results  Component Value Date   CREATININE 0.88 03/27/2016   BUN 6 03/27/2016   NA 137 03/27/2016   K 4.1 03/27/2016   CL 102 03/27/2016   CO2 25 03/27/2016   Lab Results  Component Value Date   ALT 22 03/27/2016   AST 22 03/27/2016   ALKPHOS 53 03/27/2016   BILITOT 0.1 (L) 03/27/2016   No results found for: "HGBA1C" No results found for: "INSULIN" No results found for: "TSH" No results found for: "CHOL", "HDL", "LDLCALC", "LDLDIRECT", "TRIG", "CHOLHDL" Lab Results  Component Value Date   WBC 7.5 03/27/2016   HGB 12.1 03/27/2016   HCT 37.5 03/27/2016   MCV 93.8 03/27/2016   PLT 331 03/27/2016   No results found for: "IRON", "TIBC", "FERRITIN"  Attestation Statements:  Reviewed by clinician on day of visit: allergies, medications, problem list, medical history, surgical history, family history, social history, and previous encounter notes.  Time spent on visit including pre-visit chart review and post-visit charting and face- to face care including nutritional counseling, review of EKG, interpretation of body composition scale and indirect calorimetry and nutrition prescription  was 40 minutes.   [name], D.O. DABFM, DABOM [facility] [address] [place], [place] [address] [phone]

## 2023-06-19 LAB — CBC WITH DIFFERENTIAL/PLATELET
Basophils Absolute: 0 10*3/uL (ref 0.0–0.2)
Basos: 1 %
EOS (ABSOLUTE): 0.1 10*3/uL (ref 0.0–0.4)
Eos: 1 %
Hematocrit: 38.3 % (ref 34.0–46.6)
Hemoglobin: 12.5 g/dL (ref 11.1–15.9)
Immature Grans (Abs): 0.1 10*3/uL (ref 0.0–0.1)
Immature Granulocytes: 1 %
Lymphocytes Absolute: 2.1 10*3/uL (ref 0.7–3.1)
Lymphs: 24 %
MCH: 29.3 pg (ref 26.6–33.0)
MCHC: 32.6 g/dL (ref 31.5–35.7)
MCV: 90 fL (ref 79–97)
Monocytes Absolute: 0.8 10*3/uL (ref 0.1–0.9)
Monocytes: 9 %
Neutrophils Absolute: 5.7 10*3/uL (ref 1.4–7.0)
Neutrophils: 64 %
Platelets: 313 10*3/uL (ref 150–450)
RBC: 4.26 x10E6/uL (ref 3.77–5.28)
RDW: 17.2 % — ABNORMAL HIGH (ref 11.7–15.4)
WBC: 8.8 10*3/uL (ref 3.4–10.8)

## 2023-06-19 LAB — COMPREHENSIVE METABOLIC PANEL WITH GFR
ALT: 18 IU/L (ref 0–32)
AST: 16 IU/L (ref 0–40)
Albumin: 4.6 g/dL (ref 3.9–4.9)
Alkaline Phosphatase: 74 IU/L (ref 44–121)
BUN/Creatinine Ratio: 13 (ref 9–23)
BUN: 11 mg/dL (ref 6–20)
Bilirubin Total: 0.3 mg/dL (ref 0.0–1.2)
CO2: 21 mmol/L (ref 20–29)
Calcium: 9.8 mg/dL (ref 8.7–10.2)
Chloride: 99 mmol/L (ref 96–106)
Creatinine, Ser: 0.82 mg/dL (ref 0.57–1.00)
Globulin, Total: 2.9 g/dL (ref 1.5–4.5)
Glucose: 94 mg/dL (ref 70–99)
Potassium: 4.7 mmol/L (ref 3.5–5.2)
Sodium: 135 mmol/L (ref 134–144)
Total Protein: 7.5 g/dL (ref 6.0–8.5)
eGFR: 93 mL/min/{1.73_m2} (ref >59)

## 2023-06-19 LAB — FOLATE: Folate: >20 ng/mL (ref >3.0)

## 2023-06-19 LAB — IRON AND TIBC
Iron Saturation: 18 % (ref 15–55)
Iron: 94 ug/dL (ref 27–159)
Total Iron Binding Capacity: 510 ug/dL — ABNORMAL HIGH (ref 250–450)
UIBC: 416 ug/dL (ref 131–425)

## 2023-06-19 LAB — LIPID PANEL
Chol/HDL Ratio: 5.7 ratio — ABNORMAL HIGH (ref 0.0–4.4)
Cholesterol, Total: 244 mg/dL — ABNORMAL HIGH (ref 100–199)
HDL: 43 mg/dL (ref >39)
LDL Chol Calc (NIH): 169 mg/dL — ABNORMAL HIGH (ref 0–99)
Triglycerides: 175 mg/dL — ABNORMAL HIGH (ref 0–149)
VLDL Cholesterol Cal: 32 mg/dL (ref 5–40)

## 2023-06-19 LAB — HEMOGLOBIN A1C
Est. average glucose Bld gHb Est-mCnc: 126 mg/dL
Hgb A1c MFr Bld: 6 % — ABNORMAL HIGH (ref 4.8–5.6)

## 2023-06-19 LAB — VITAMIN D 25 HYDROXY (VIT D DEFICIENCY, FRACTURES): Vit D, 25-Hydroxy: 41.8 ng/mL (ref 30.0–100.0)

## 2023-06-19 LAB — T4, FREE: Free T4: 1.03 ng/dL (ref 0.82–1.77)

## 2023-06-19 LAB — FERRITIN: Ferritin: 13 ng/mL — ABNORMAL LOW (ref 15–150)

## 2023-06-19 LAB — VITAMIN B12: Vitamin B-12: 442 pg/mL (ref 232–1245)

## 2023-06-19 LAB — TSH: TSH: 0.571 u[IU]/mL (ref 0.450–4.500)

## 2023-06-19 LAB — T3: T3, Total: 143 ng/dL (ref 71–180)

## 2023-06-19 LAB — INSULIN, RANDOM: INSULIN: 16.3 u[IU]/mL (ref 2.6–24.9)

## 2023-07-02 ENCOUNTER — Ambulatory Visit: Admitting: Family Medicine

## 2023-07-02 ENCOUNTER — Telehealth: Payer: Self-pay | Admitting: *Deleted

## 2023-07-02 ENCOUNTER — Encounter: Payer: Self-pay | Admitting: Family Medicine

## 2023-07-02 VITALS — BP 141/93 | HR 80 | Temp 98.2°F | Ht 63.0 in | Wt 178.0 lb

## 2023-07-02 DIAGNOSIS — I10 Essential (primary) hypertension: Secondary | ICD-10-CM | POA: Diagnosis not present

## 2023-07-02 DIAGNOSIS — D500 Iron deficiency anemia secondary to blood loss (chronic): Secondary | ICD-10-CM | POA: Diagnosis not present

## 2023-07-02 DIAGNOSIS — R7303 Prediabetes: Secondary | ICD-10-CM | POA: Diagnosis not present

## 2023-07-02 DIAGNOSIS — E66811 Obesity, class 1: Secondary | ICD-10-CM

## 2023-07-02 DIAGNOSIS — R9431 Abnormal electrocardiogram [ECG] [EKG]: Secondary | ICD-10-CM | POA: Diagnosis not present

## 2023-07-02 DIAGNOSIS — Z6831 Body mass index (BMI) 31.0-31.9, adult: Secondary | ICD-10-CM

## 2023-07-02 DIAGNOSIS — E785 Hyperlipidemia, unspecified: Secondary | ICD-10-CM

## 2023-07-02 DIAGNOSIS — E6609 Other obesity due to excess calories: Secondary | ICD-10-CM

## 2023-07-02 DIAGNOSIS — E88819 Insulin resistance, unspecified: Secondary | ICD-10-CM

## 2023-07-02 MED ORDER — WEGOVY 0.25 MG/0.5ML ~~LOC~~ SOAJ: 0.2500 mg | SUBCUTANEOUS | 0 refills | Status: DC

## 2023-07-02 MED ORDER — POLYSACCHARIDE IRON COMPLEX 150 MG PO CAPS: 150.0000 mg | ORAL_CAPSULE | Freq: Every day | ORAL | 0 refills | Status: DC

## 2023-07-02 NOTE — Progress Notes (Signed)
 Office: [phone]  /  Fax: [phone]  WEIGHT SUMMARY AND BIOMETRICS  Starting Date: 06/18/23  Starting Weight: 177lb   Weight Lost Since Last Visit: 0lb   Vitals Temp: 98.2 F (36.8 C) BP: (!) 141/93 Pulse Rate: 80 SpO2: 98 %   Body Composition  Body Fat %: 34.5 % Fat Mass (lbs): 61.4 lbs Muscle Mass (lbs): 110.8 lbs Total Body Water (lbs): 69.4 lbs Visceral Fat Rating : 7    HPI  Chief Complaint: OBESITY  Sheneta is here to discuss her progress with her obesity treatment plan. She is on the the Category 3 Plan and states she is following her eating plan approximately 40 % of the time. She states she is walking more.   Interval History:  Since last office visit she is up 1 lb She has some hunger but is occasionally skipping meals She has been trying out protein shakes She did swap out SSBs for zero sugar She has a good support system She is getting in fruits and veggies She has been doing some walking She is going back to school next month (from home)  Pharmacotherapy: none  PHYSICAL EXAM:  Blood pressure (!) 141/93, pulse 80, temperature 98.2 F (36.8 C), height 5\' 3"  (1.6 m), weight 178 lb (80.7 kg), SpO2 98%. Body mass index is 31.53 kg/m.  General: She is overweight, cooperative, alert, well developed, and in no acute distress. PSYCH: Has normal mood, affect and thought process.   Lungs: Normal breathing effort, no conversational dyspnea.   ASSESSMENT AND PLAN  TREATMENT PLAN FOR OBESITY:  Recommended Dietary Goals  Donnelle is currently in the action stage of change. As such, her goal is to continue weight management plan. She has agreed to the Category 3 Plan.minus 100 snack cal (1400 cal/ day)  Behavioral Intervention  We discussed the following Behavioral Modification Strategies today: increasing lean protein intake to established goals, work on meal planning and preparation, keeping healthy foods at home, identifying sources and  decreasing liquid calories, practice mindfulness eating and understand the difference between hunger signals and cravings, avoiding temptations and identifying enticing environmental cues, continue to work on implementation of reduced calorie nutritional plan, continue to practice mindfulness when eating, and continue to work on maintaining a reduced calorie state, getting the recommended amount of protein, incorporating whole foods, making healthy choices, staying well hydrated and practicing mindfulness when eating..  Additional resources provided today: NA  Recommended Physical Activity Goals  Zoeigh has been advised to work up to 150 minutes of moderate intensity aerobic activity a week and strengthening exercises 2-3 times per week for cardiovascular health, weight loss maintenance and preservation of muscle mass.   She has agreed to Start aerobic activity with a goal of 150 minutes a week at moderate intensity.  We set a workout or walking goal for at least 2 days/ wk  Pharmacotherapy changes for the treatment of obesity: begin Wegovy 0.25 mg weekly injection Patient denies a personal or family history of pancreatitis, medullary thyroid carcinoma or multiple endocrine neoplasia type II. Recommend reviewing pen training video online.  ASSOCIATED CONDITIONS ADDRESSED TODAY  T wave inversion in EKG Asymptomatic Has poorly controlled HTN and is a smoker -     Ambulatory referral to Cardiology  Iron deficiency anemia due to chronic blood loss Reviewed labs with patient.  Her ferritin is still low and her total iron binding capacity is high.  She has complaints of fatigue.  She has been taking a prescription iron sulfate  through her PCP but has some GI upset.  Will change her prescription iron over to Niferex 150 mg capsule daily.  She may take this with over-the-counter vitamin C. -     Polysaccharide Iron Complex; Take 1 capsule (150 mg total) by mouth daily.  Dispense: 30 capsule;  Refill: 0  Class 1 obesity due to excess calories with serious comorbidity and body mass index (BMI) of 31.0 to 31.9 in adult -     ZOXWRU; Inject 0.25 mg into the skin once a week.  Dispense: 2 mL; Refill: 0 She is still complaining of hunger and food noise on her prescribed meal plan with some room for improvement with consistency and adding in regular exercise.  She has never used an antiobesity medication and is a good candidate for use of Wegovy especially given with insulin  resistance and prediabetes.  We discussed mechanism of action and potential adverse side effects.  She will use Wegovy along with her 1400-calorie high-protein low sugar diet.  Primary hypertension Blood pressure is still mildly elevated on hydralazine 25 mg daily, HCTZ 25 mg daily, losartan/HCTZ 50/12.5 mg daily.  Her blood pressure has been managed through primary care.  Referral to cardiology has been made for T wave inversion on consecutive leads on her EKG. Continue active plan for weight reduction.  Prediabetes Lab Results  Component Value Date   HGBA1C 6.0 (H) 06/18/2023  Reviewed lab results with patient.  She is in the prediabetic range.  She has previously used metformin for rate.  Time without adverse side effect.  She has been working on reducing her intake of sugar sweetened beverages, reducing intake of starches and has plans to increase her walking time.  Look for improvement with A1c with lifestyle change, weight reduction and use of Wegovy.  Recheck A1c in 6 months  Insulin  resistance Reviewed lab results with patient.  Her fasting insulin  is over 15 consistent with insulin  resistance.  We discussed how insulin  resistance can contribute to body fat deposition, increased appetite and leads to prediabetes and type 2 diabetes  Look for improvements in fasting insulin  with use of Wegovy plus lifestyle change.  Avoid high sugar foods and drinks and ramp up exercise time to at least 2 days a  week.  Hyperlipidemia, unspecified hyperlipidemia type Lab Results  Component Value Date   CHOL 244 (H) 06/18/2023   HDL 43 06/18/2023   LDLCALC 169 (H) 06/18/2023   TRIG 175 (H) 06/18/2023   CHOLHDL 5.7 (H) 06/18/2023  Reviewed lab result with patient.  She has a high LDL and triglyceride level.  She has restarted an over-the-counter fish oil supplement.  She has never used statin medication.  She is status post bilateral tubal ligation.  Look for lipid improvement on her low saturated fat/low sugar diet.      She was informed of the importance of frequent follow up visits to maximize her success with intensive lifestyle modifications for her multiple health conditions.   ATTESTASTION STATEMENTS:  Reviewed by clinician on day of visit: allergies, medications, problem list, medical history, surgical history, family history, social history, and previous encounter notes pertinent to obesity diagnosis.   I have personally spent 34 minutes total time today in preparation, patient care, nutritional counseling and education,  and documentation for this visit, including the following: review of most recent clinical lab tests, prescribing medications/ refilling medications, reviewing medical assistant documentation, review and interpretation of bioimpedence results.     [name], D.O. DABFM, DABOM [facility] [address] [place], [place] [address] [phone]

## 2023-07-02 NOTE — Patient Instructions (Addendum)
 For lactose intolerance, choose whey protein ISOLATE instead of concentrate  Change iron to RX Nifferex + vitamin C daily  Plan to start Wegovy 0.25 mg weekly  Plan to ramp up exercise time to 2 days/ wk

## 2023-07-02 NOTE — Telephone Encounter (Signed)
 Prior authorization done via cover my meds for patients. Wegovy. Waiting on determination.

## 2023-07-06 NOTE — Telephone Encounter (Signed)
 Prior authorization was approved for patients Wegovy .

## 2023-07-28 ENCOUNTER — Other Ambulatory Visit: Payer: Self-pay | Admitting: Family Medicine

## 2023-07-28 DIAGNOSIS — E66811 Obesity, class 1: Secondary | ICD-10-CM

## 2023-07-30 ENCOUNTER — Other Ambulatory Visit: Payer: Self-pay | Admitting: Family Medicine

## 2023-07-30 DIAGNOSIS — D500 Iron deficiency anemia secondary to blood loss (chronic): Secondary | ICD-10-CM

## 2023-08-06 ENCOUNTER — Encounter: Payer: Self-pay | Admitting: Family Medicine

## 2023-08-06 ENCOUNTER — Ambulatory Visit: Admitting: Family Medicine

## 2023-08-06 VITALS — BP 127/82 | HR 94 | Temp 98.2°F | Ht 63.0 in | Wt 171.0 lb

## 2023-08-06 DIAGNOSIS — I10 Essential (primary) hypertension: Secondary | ICD-10-CM | POA: Diagnosis not present

## 2023-08-06 DIAGNOSIS — Z6830 Body mass index (BMI) 30.0-30.9, adult: Secondary | ICD-10-CM

## 2023-08-06 DIAGNOSIS — D500 Iron deficiency anemia secondary to blood loss (chronic): Secondary | ICD-10-CM

## 2023-08-06 DIAGNOSIS — R7303 Prediabetes: Secondary | ICD-10-CM

## 2023-08-06 DIAGNOSIS — E559 Vitamin D deficiency, unspecified: Secondary | ICD-10-CM

## 2023-08-06 DIAGNOSIS — E66811 Obesity, class 1: Secondary | ICD-10-CM

## 2023-08-06 MED ORDER — WEGOVY 0.5 MG/0.5ML ~~LOC~~ SOAJ: 0.5000 mg | SUBCUTANEOUS | 0 refills | Status: DC

## 2023-08-06 MED ORDER — VITAMIN D (ERGOCALCIFEROL) 1.25 MG (50000 UNIT) PO CAPS: 50000.0000 [IU] | ORAL_CAPSULE | ORAL | 0 refills | Status: DC

## 2023-08-06 MED ORDER — POLYSACCHARIDE IRON COMPLEX 150 MG PO CAPS: 150.0000 mg | ORAL_CAPSULE | Freq: Every day | ORAL | 0 refills | Status: DC

## 2023-08-06 NOTE — Progress Notes (Signed)
 Office: [phone]  /  Fax: [phone]  WEIGHT SUMMARY AND BIOMETRICS  Starting Date: 06/18/23  Starting Weight: 177lb   Weight Lost Since Last Visit: 7lb   Vitals Temp: 98.2 F (36.8 C) BP: 127/82 Pulse Rate: 94 SpO2: 100 %   Body Composition  Body Fat %: 35.3 % Fat Mass (lbs): 60.6 lbs Muscle Mass (lbs): 105.4 lbs Total Body Water (lbs): 69.6 lbs Visceral Fat Rating : 7     HPI  Chief Complaint: OBESITY  Tiffany Weiss is here to discuss her progress with her obesity treatment plan. She is on the the Category 3 Plan and states she is following her eating plan approximately 60-70 % of the time. She states she is exercising 0 minutes 0 times per week.   Interval History:  Since last office visit she is down 7 lb She completed 4 injections of Wegovy  0.25 mg weekly She has improved food volumes with Wegovy  and less snacking and cravings She denies GI upset She plans to start weight training at the gym She denies meal skipping and has been mindful of protein intake This gives her a net weight loss of 6 lb in 1 month She is back in school for EKG tech  Pharmacotherapy: Wegovy  0.25 mg weekly  PHYSICAL EXAM:  Blood pressure 127/82, pulse 94, temperature 98.2 F (36.8 C), height 5' 3 (1.6 m), weight 171 lb (77.6 kg), SpO2 100%. Body mass index is 30.29 kg/m.  General: She is overweight, cooperative, alert, well developed, and in no acute distress. PSYCH: Has normal mood, affect and thought process.   Lungs: Normal breathing effort, no conversational dyspnea.   ASSESSMENT AND PLAN  TREATMENT PLAN FOR OBESITY:  Recommended Dietary Goals  Tiffany Weiss is currently in the action stage of change. As such, her goal is to continue weight management plan. She has agreed to the Category 3 Plan and keeping a food journal and adhering to recommended goals of 1400 calories and 90 g of  protein.  Behavioral Intervention  We discussed the following Behavioral  Modification Strategies today: increasing lean protein intake to established goals, increasing fiber rich foods, increasing water intake , work on tracking and journaling calories using tracking application, keeping healthy foods at home, identifying sources and decreasing liquid calories, avoiding temptations and identifying enticing environmental cues, planning for success, and continue to work on maintaining a reduced calorie state, getting the recommended amount of protein, incorporating whole foods, making healthy choices, staying well hydrated and practicing mindfulness when eating..  Additional resources provided today: NA  Recommended Physical Activity Goals  Tiffany Weiss has been advised to work up to 150 minutes of moderate intensity aerobic activity a week and strengthening exercises 2-3 times per week for cardiovascular health, weight loss maintenance and preservation of muscle mass.   She has agreed to [employer] strengthening exercises with a goal of 2-3 sessions a week  and Start aerobic activity with a goal of 150 minutes a week at moderate intensity.  Aim for gym workouts to include weight training 3 days/ wk  Pharmacotherapy changes for the treatment of obesity: increase Wegovy  to 0.5 mg weekly  ASSOCIATED CONDITIONS ADDRESSED TODAY  Iron  deficiency anemia due to chronic blood loss Lab Results  Component Value Date   IRON  94 06/18/2023   TIBC 510 (H) 06/18/2023   FERRITIN 13 (L) 06/18/2023  Doing well on RX iron  daily without constipation or abdominal pain Energy level unchanged Repeat labs in August  -     Polysaccharide Iron  Complex;  Take 1 capsule (150 mg total) by mouth daily.  Dispense: 30 capsule; Refill: 0  Class 1 obesity due to excess calories with body mass index (BMI) of 30.0 to 30.9 in adult, unspecified whether serious comorbidity present Doing well after the first 4 weeks on Wegovy  with adequate weight loss but is losing more muscle than expected.  Advised hitting  protein target of 90 grams/ day and adding in weight training at the gym 3 days/ wk.  Avoid pregnancy while on Wegovy .   Increase to:  -     Wegovy ; Inject 0.5 mg into the skin once a week.  Dispense: 2 mL; Refill: 0  Vitamin D  deficiency Last vitamin D  Lab Results  Component Value Date   VD25OH 41.8 06/18/2023  Doing well on RX vitamin D  weekly Repeat lab in August  -     Vitamin D  (Ergocalciferol ); Take 1 capsule (50,000 Units total) by mouth once a week.  Dispense: 5 capsule; Refill: 0  Primary hypertension BP is at goal on current meds Has upcoming visit with cardiology 9/8 for HTN and extensive TWI on initial EKG  Prediabetes Lab Results  Component Value Date   HGBA1C 6.0 (H) 06/18/2023   Working on reducing intake of starches and sugar Has room for improvement with exercise Doing well on Wegovy  + prescribed diet Off of metformin Repeat A1c in August     She was informed of the importance of frequent follow up visits to maximize her success with intensive lifestyle modifications for her multiple health conditions.   ATTESTASTION STATEMENTS:  Reviewed by clinician on day of visit: allergies, medications, problem list, medical history, surgical history, family history, social history, and previous encounter notes pertinent to obesity diagnosis.   I have personally spent 30 minutes total time today in preparation, patient care, nutritional counseling and education,  and documentation for this visit, including the following: review of most recent clinical lab tests, prescribing medications/ refilling medications, reviewing medical assistant documentation, review and interpretation of bioimpedence results.     [name], D.O. DABFM, DABOM [facility] [address] [place], [place] [address] [phone]

## 2023-09-02 ENCOUNTER — Telehealth (INDEPENDENT_AMBULATORY_CARE_PROVIDER_SITE_OTHER): Payer: Self-pay

## 2023-09-02 ENCOUNTER — Encounter: Payer: Self-pay | Admitting: Family Medicine

## 2023-09-02 ENCOUNTER — Ambulatory Visit: Admitting: Family Medicine

## 2023-09-02 VITALS — BP 142/87 | HR 68 | Temp 98.0°F | Ht 63.0 in | Wt 167.0 lb

## 2023-09-02 DIAGNOSIS — R7303 Prediabetes: Secondary | ICD-10-CM

## 2023-09-02 DIAGNOSIS — R12 Heartburn: Secondary | ICD-10-CM

## 2023-09-02 DIAGNOSIS — E559 Vitamin D deficiency, unspecified: Secondary | ICD-10-CM | POA: Diagnosis not present

## 2023-09-02 DIAGNOSIS — Z6829 Body mass index (BMI) 29.0-29.9, adult: Secondary | ICD-10-CM

## 2023-09-02 DIAGNOSIS — D500 Iron deficiency anemia secondary to blood loss (chronic): Secondary | ICD-10-CM | POA: Diagnosis not present

## 2023-09-02 DIAGNOSIS — I10 Essential (primary) hypertension: Secondary | ICD-10-CM

## 2023-09-02 DIAGNOSIS — E663 Overweight: Secondary | ICD-10-CM

## 2023-09-02 MED ORDER — IRON (FERROUS SULFATE) 325 (65 FE) MG PO TABS: 325.0000 mg | ORAL_TABLET | Freq: Every day | ORAL | 1 refills | Status: DC

## 2023-09-02 MED ORDER — OMEPRAZOLE 20 MG PO TBEC: DELAYED_RELEASE_TABLET | ORAL | 1 refills | Status: DC

## 2023-09-02 MED ORDER — VITAMIN D (ERGOCALCIFEROL) 1.25 MG (50000 UNIT) PO CAPS
50000.0000 [IU] | ORAL_CAPSULE | ORAL | 0 refills | Status: DC
Start: 2023-09-02 — End: 2023-09-28

## 2023-09-02 MED ORDER — WEGOVY 0.5 MG/0.5ML ~~LOC~~ SOAJ: 0.5000 mg | SUBCUTANEOUS | 0 refills | Status: DC

## 2023-09-02 NOTE — Telephone Encounter (Signed)
 PA for Omeprazole  has been submitted, awaiting PA questions.

## 2023-09-02 NOTE — Telephone Encounter (Signed)
 PA questions for Omeprazole   have been answered and all documentation has been included. Waiting on a determination.

## 2023-09-02 NOTE — Patient Instructions (Signed)
 Stay on Wegovy  0.5 mg weekly injection  Get started with training at the gym 2-3 x a week Continue with walking on a regular basis  Hydrate well with water  Add in 1-2 fresh fruit servings Get in plenty of veggies with dinner   Add in RX Omeprazole  at bedtime daily for reflux

## 2023-09-02 NOTE — Progress Notes (Signed)
 Office: [phone]  /  Fax: [phone]  WEIGHT SUMMARY AND BIOMETRICS  Starting Date: 06/18/23  Starting Weight: 177lb   Weight Lost Since Last Visit: 4lb   Vitals Temp: 98 F (36.7 C) BP: (!) 142/87 Pulse Rate: 68 SpO2: 98 %   Body Composition  Body Fat %: 34.2 % Fat Mass (lbs): 57.2 lbs Muscle Mass (lbs): 104.4 lbs Total Body Water (lbs): 67.2 lbs Visceral Fat Rating : 6   HPI  Chief Complaint: OBESITY  Tiffany Weiss is here to discuss her progress with her obesity treatment plan. She is on the the Category 3 Plan and states she is following her eating plan approximately 40 % of the time. She states she is exercising 0 minutes 0 times per week.  Interval History:  Since last office visit she is down 4 lb This gives her a net loss of 10 lb in 2 mos of medically supervised weight management She has been having more heartburn- needing Tums some days She met with a trainer at the gym and will be getting started with strength training 2-3 x a week She has been doing some walking but nothing formal Stress levels have been high with work  She has enjoyed the satiety from Wegovy  0.5 mg weekly with some meal skipping  Pharmacotherapy: Wegovy  0.5 mg weekly  PHYSICAL EXAM:  Blood pressure (!) 142/87, pulse 68, temperature 98 F (36.7 C), height 5' 3 (1.6 m), weight 167 lb (75.8 kg), SpO2 98%. Body mass index is 29.58 kg/m.  General: She is overweight, cooperative, alert, well developed, and in no acute distress. PSYCH: Has normal mood, affect and thought process.   Lungs: Normal breathing effort, no conversational dyspnea.  ASSESSMENT AND PLAN  TREATMENT PLAN FOR OBESITY:  Recommended Dietary Goals  Tiffany Weiss is currently in the action stage of change. As such, her goal is to continue weight management plan. She has agreed to the Category 3 Plan.  Behavioral Intervention  We discussed the following Behavioral Modification Strategies today: increasing lean  protein intake to established goals, increasing fiber rich foods, avoiding skipping meals, increasing water intake , work on meal planning and preparation, keeping healthy foods at home, decreasing eating out or consumption of processed foods, and making healthy choices when eating convenient foods, practice mindfulness eating and understand the difference between hunger signals and cravings, work on managing stress, creating time for self-care and relaxation, avoiding temptations and identifying enticing environmental cues, and continue to work on maintaining a reduced calorie state, getting the recommended amount of protein, incorporating whole foods, making healthy choices, staying well hydrated and practicing mindfulness when eating..  Additional resources provided today: NA  Recommended Physical Activity Goals  Tiffany Weiss has been advised to work up to 150 minutes of moderate intensity aerobic activity a week and strengthening exercises 2-3 times per week for cardiovascular health, weight loss maintenance and preservation of muscle mass.   She has agreed to Think about enjoyable ways to increase daily physical activity and overcoming barriers to exercise and Increase physical activity in their day and reduce sedentary time (increase NEAT).  Pharmacotherapy changes for the treatment of obesity: none  ASSOCIATED CONDITIONS ADDRESSED TODAY  Vitamin D  deficiency Last vitamin D  Lab Results  Component Value Date   VD25OH 41.8 06/18/2023  She is doing well on RX vitamin D  weekly Goal >50 Repeat vitamin D  level in 1-2 mos  -     Vitamin D  (Ergocalciferol ); Take 1 capsule (50,000 Units total) by mouth once a week.  Dispense: 5 capsule; Refill: 0  BMI 29.0-29.9,adult  Overweight (BMI 25.0-29.9) She has adequate satiety without nausea, abdominal pain or constipation on Wegovy  0.5 mg weekly with some meal skipping. Will work on avoiding meal skipping, hydration with water and adding in a protein  meal replacement.  Continue Wegovy  at 0.5 mg weekly -     Wegovy ; Inject 0.5 mg into the skin once a week.  Dispense: 2 mL; Refill: 0  Iron  deficiency anemia due to chronic blood loss She has been off of her RX iron  for the past month since pharmacy did not have Nifferex Change to RX ferrous sulfate  325 mg daily Lab Results  Component Value Date   IRON  94 06/18/2023   TIBC 510 (H) 06/18/2023   FERRITIN 13 (L) 06/18/2023   Repeat iron  levels in 1-2 mos  -     Iron  (Ferrous Sulfate ); Take 325 mg by mouth daily.  Dispense: 30 tablet; Refill: 1  Heartburn Worsened with Wegovy  use Needing frequent TuMS which can be constipating Begin Omeprazole  20 mg daily at night Avoid late night eating and high acid foods -     Omeprazole ; 1 tab po at bedtime daily  Dispense: 30 tablet; Refill: 1  Primary hypertension BP is elevated today on Hydralazine 25 mg daily, losartan- HTCZ 50-12.5 mg daily Stress levels are high Continue all listed anti hypertensive meds  Avoid use of stimulant type AOMs  Prediabetes Lab Results  Component Value Date   HGBA1C 6.0 (H) 06/18/2023   Continue cat 3 meal plan Ramp up walking time, continue weight training 3 x a week Repeat A1c in 1-2 mos     She was informed of the importance of frequent follow up visits to maximize her success with intensive lifestyle modifications for her multiple health conditions.   ATTESTASTION STATEMENTS:  Reviewed by clinician on day of visit: allergies, medications, problem list, medical history, surgical history, family history, social history, and previous encounter notes pertinent to obesity diagnosis.   I have personally spent 30 minutes total time today in preparation, patient care, nutritional counseling and education,  and documentation for this visit, including the following: review of most recent clinical lab tests, prescribing medications/ refilling medications, reviewing medical assistant documentation, review and  interpretation of bioimpedence results.     Darice Haddock, D.O. DABFM, DABOM [facility] [address] [place], [place] [address] [phone]

## 2023-09-03 NOTE — Telephone Encounter (Signed)
 PA for Omeprazole  has been denied. PA is now complete.      Denied on July 16 by [facility] Medicaid 2017 Denied. Per the health plan's preferred drug list, at least 2 preferred drugs must be tried before requesting this drug or tell us  why the member cannot try any preferred alternatives. Please send us  supporting chart notes and lab results. Here is list of preferred alternatives: Dexilant capsule, esomeprazole magnesium capsule (generic for Nexium Rx), lansoprazole capsule (generic for Prevacid Rx), Nexium Rx packet, omeprazole  Rx capsule (generic for Prilosec Rx), pantoprazole  tablet (generic for Protonix ), Protonix  Suspension. Note: Some preferred drug(s) may have quantity limits. Refer to the health plan's preferred drug list for additional details.

## 2023-09-28 ENCOUNTER — Ambulatory Visit: Admitting: Family Medicine

## 2023-09-28 ENCOUNTER — Encounter: Payer: Self-pay | Admitting: Family Medicine

## 2023-09-28 VITALS — BP 143/88 | HR 69 | Temp 98.2°F | Ht 63.0 in | Wt 160.0 lb

## 2023-09-28 DIAGNOSIS — E663 Overweight: Secondary | ICD-10-CM

## 2023-09-28 DIAGNOSIS — E559 Vitamin D deficiency, unspecified: Secondary | ICD-10-CM

## 2023-09-28 DIAGNOSIS — Z6828 Body mass index (BMI) 28.0-28.9, adult: Secondary | ICD-10-CM

## 2023-09-28 DIAGNOSIS — D500 Iron deficiency anemia secondary to blood loss (chronic): Secondary | ICD-10-CM | POA: Diagnosis not present

## 2023-09-28 DIAGNOSIS — I10 Essential (primary) hypertension: Secondary | ICD-10-CM

## 2023-09-28 DIAGNOSIS — R5383 Other fatigue: Secondary | ICD-10-CM

## 2023-09-28 DIAGNOSIS — R7303 Prediabetes: Secondary | ICD-10-CM

## 2023-09-28 MED ORDER — VITAMIN D (ERGOCALCIFEROL) 1.25 MG (50000 UNIT) PO CAPS: 50000.0000 [IU] | ORAL_CAPSULE | ORAL | 0 refills | Status: DC

## 2023-09-28 MED ORDER — LOSARTAN POTASSIUM-HCTZ 50-12.5 MG PO TABS: 1.0000 | ORAL_TABLET | Freq: Every day | ORAL | 2 refills | Status: DC

## 2023-09-28 MED ORDER — WEGOVY 1 MG/0.5ML ~~LOC~~ SOAJ: 1.0000 mg | SUBCUTANEOUS | 0 refills | Status: DC

## 2023-09-28 NOTE — Progress Notes (Signed)
 Office: [phone]  /  Fax: [phone]  WEIGHT SUMMARY AND BIOMETRICS  Starting Date: 06/18/23  Starting Weight: 177lb   Weight Lost Since Last Visit: 7lb   Vitals Temp: 98.2 F (36.8 C) BP: (!) 143/88 Pulse Rate: 69 SpO2: 100 %   Body Composition  Body Fat %: 32.1 % Fat Mass (lbs): 51.4 lbs Muscle Mass (lbs): 103.2 lbs Total Body Water (lbs): 65.2 lbs Visceral Fat Rating : 6    HPI  Chief Complaint: OBESITY  Tiffany Weiss is here to discuss her progress with her obesity treatment plan. She is on the the Category 3 Plan and states she is following her eating plan approximately 60 % of the time. She states she is exercising 0 minutes 0 times per week.  Interval History:  Since last office visit she is down 7 lb She is down 1.2 pounds of muscle mass and down 5.8 pounds of body fat since last visit This gives her a net weight loss of 17  lb in the past 3 mos for medically supervised weight management This is a 9.6% total body weight loss She is walking 30 min 2 days/ wk and has joined the gym Her fiance has been diagnosed with lung cancer and this has been stressful She plans to either go to the gym or workout at home or start boxing She has had less nausea on Wegovy   Pharmacotherapy: Wegovy  0.5 mg once weekly injection  PHYSICAL EXAM:  Blood pressure (!) 143/88, pulse 69, temperature 98.2 F (36.8 C), height 5' 3 (1.6 m), weight 160 lb (72.6 kg), SpO2 100%. Body mass index is 28.34 kg/m.  General: She is healthy appearing, cooperative, alert, well developed, and in no acute distress. PSYCH: Has normal mood, affect and thought process.   Lungs: Normal breathing effort, no conversational dyspnea.  ASSESSMENT AND PLAN  TREATMENT PLAN FOR OBESITY:  Recommended Dietary Goals  Tiffany Weiss is currently in the action stage of change. As such, her goal is to continue weight management plan. She has agreed to the Category 3 Plan.  Behavioral Intervention  We  discussed the following Behavioral Modification Strategies today: increasing lean protein intake to established goals, increasing fiber rich foods, increasing water intake , work on meal planning and preparation, keeping healthy foods at home, continue to practice mindfulness when eating, planning for success, and continue to work on maintaining a reduced calorie state, getting the recommended amount of protein, incorporating whole foods, making healthy choices, staying well hydrated and practicing mindfulness when eating..  Additional resources provided today: NA  Recommended Physical Activity Goals  Tiffany Weiss has been advised to work up to 150 minutes of moderate intensity aerobic activity a week and strengthening exercises 2-3 times per week for cardiovascular health, weight loss maintenance and preservation of muscle mass.   She has agreed to Increase the intensity, frequency or duration of strengthening exercises  and Increase the intensity, frequency or duration of aerobic exercises   Aim for workouts 3 days a week to include either gym or boxing workouts Recommend tracking of daily steps  Pharmacotherapy changes for the treatment of obesity: Increase Wegovy  to 1 mg once weekly injections  ASSOCIATED CONDITIONS ADDRESSED TODAY  Iron  deficiency anemia due to chronic blood loss She has been taking iron  sulfate 325 mg daily.  She has a history of dysfunctional uterine bleeding.  She has been tolerating her oral iron  supplement well.  Recheck iron  levels today. -     Ferritin -     Iron   and TIBC  Vitamin D  deficiency Last vitamin D  Lab Results  Component Value Date   VD25OH 41.8 06/18/2023  She has been taking vitamin D  50,000 IU once weekly with a target vitamin D  level over 50.  Recheck labs today  -     Vitamin D  (Ergocalciferol ); Take 1 capsule (50,000 Units total) by mouth once a week.  Dispense: 5 capsule; Refill: 0 -     VITAMIN D  25 Hydroxy (Vit-D Deficiency,  Fractures)  Overweight (BMI 25.0-29.9) Improving without GI upset from Wegovy  0.5 mg once weekly injection.  Weight meal skipping.  Continue to work on mindful eating, healthy food choices on prescribed diet with a focus on lean protein and fiber with meals, adding in regular exercise while increasing Wegovy  to 1 mg once weekly injection -     Wegovy ; Inject 1 mg into the skin once a week.  Dispense: 2 mL; Refill: 0  Primary hypertension Blood pressure is slightly elevated today.  She ran out of her losartan  HCTZ and has not yet been able to get back in with her PCP.  Refill losartan /HCTZ 50/12.5 mg once daily.  This prescription may be taken back or by her PCP at her next visit. -     Losartan  Potassium-HCTZ; Take 1 tablet by mouth daily.  Dispense: 30 tablet; Refill: 2  Prediabetes Lab Results  Component Value Date   HGBA1C 6.0 (H) 06/18/2023  She has been doing well with weight reduction on her prescribed diet.  She has plans to ramp up exercise.  Look for improvements in prediabetes.  Recheck A1c today  -     Wegovy ; Inject 1 mg into the skin once a week.  Dispense: 2 mL; Refill: 0 -     Insulin , random -     Hemoglobin A1c  Other fatigue -     Basic metabolic panel with GFR      She was informed of the importance of frequent follow up visits to maximize her success with intensive lifestyle modifications for her multiple health conditions.   ATTESTASTION STATEMENTS:  Reviewed by clinician on day of visit: allergies, medications, problem list, medical history, surgical history, family history, social history, and previous encounter notes pertinent to obesity diagnosis.   I have personally spent 31 minutes total time today in preparation, patient care, nutritional counseling and education,  and documentation for this visit, including the following: review of most recent clinical lab tests, prescribing medications/ refilling medications, reviewing medical assistant documentation,  review and interpretation of bioimpedence results.     Tiffany Weiss, D.O. DABFM, DABOM [facility] [address] [place], [place] [address] [phone]

## 2023-10-01 LAB — HEMOGLOBIN A1C
Est. average glucose Bld gHb Est-mCnc: 111 mg/dL
Hgb A1c MFr Bld: 5.5 % (ref 4.8–5.6)

## 2023-10-01 LAB — BASIC METABOLIC PANEL WITH GFR
BUN/Creatinine Ratio: 11 (ref 9–23)
BUN: 9 mg/dL (ref 6–20)
CO2: 19 mmol/L — ABNORMAL LOW (ref 20–29)
Calcium: 9.6 mg/dL (ref 8.7–10.2)
Chloride: 99 mmol/L (ref 96–106)
Creatinine, Ser: 0.82 mg/dL (ref 0.57–1.00)
Glucose: 78 mg/dL (ref 70–99)
Potassium: 4.4 mmol/L (ref 3.5–5.2)
Sodium: 136 mmol/L (ref 134–144)
eGFR: 93 mL/min/1.73 (ref >59)

## 2023-10-01 LAB — INSULIN, RANDOM: INSULIN: 8.2 u[IU]/mL (ref 2.6–24.9)

## 2023-10-01 LAB — IRON AND TIBC
Iron Saturation: 30 % (ref 15–55)
Iron: 129 ug/dL (ref 27–159)
Total Iron Binding Capacity: 433 ug/dL (ref 250–450)
UIBC: 304 ug/dL (ref 131–425)

## 2023-10-01 LAB — VITAMIN D 25 HYDROXY (VIT D DEFICIENCY, FRACTURES): Vit D, 25-Hydroxy: 46 ng/mL (ref 30.0–100.0)

## 2023-10-01 LAB — FERRITIN: Ferritin: 31 ng/mL (ref 15–150)

## 2023-10-12 ENCOUNTER — Ambulatory Visit: Payer: Self-pay | Admitting: Family Medicine

## 2023-10-26 ENCOUNTER — Ambulatory Visit: Attending: Cardiology | Admitting: Cardiology

## 2023-10-26 ENCOUNTER — Encounter: Payer: Self-pay | Admitting: Cardiology

## 2023-10-26 VITALS — BP 145/88 | HR 77 | Resp 16 | Ht 63.0 in | Wt 156.4 lb

## 2023-10-26 DIAGNOSIS — R9431 Abnormal electrocardiogram [ECG] [EKG]: Secondary | ICD-10-CM | POA: Diagnosis not present

## 2023-10-26 DIAGNOSIS — F17210 Nicotine dependence, cigarettes, uncomplicated: Secondary | ICD-10-CM | POA: Insufficient documentation

## 2023-10-26 DIAGNOSIS — I10 Essential (primary) hypertension: Secondary | ICD-10-CM | POA: Diagnosis not present

## 2023-10-26 NOTE — Progress Notes (Signed)
 Cardiology Office Note:    Date:  10/26/2023  NAME:  Tiffany Weiss    MRN: [id number] DOB:  1985-07-13   PCP:  Earvin Johnston PARAS, FNP  Former Cardiology Providers: none Primary Cardiologist:  Madonna Large, DO, [facility] (established care 10/26/2023) Electrophysiologist:  None   Referring MD: Waylan Darice BRAVO, DO  Reason of Consult: Nonspecific EKG changes  Chief Complaint  Patient presents with   Abnormal ECG   New Patient (Initial Visit)    History of Present Illness:    Tiffany Weiss is a 38 y.o. African-American female whose past medical history and cardiovascular risk factors includes: Hypertension, cigarette smoking. She is being seen today for the evaluation of nonspecific EKG changes at the request of Bowen, Darice BRAVO, DO.  Patient is currently being followed by [facility] health and wellness clinic for weight loss management.  She has lost approximately 20 pounds over the last 3 months.  Patient had an EKG at their facility and she was told that her EKG was abnormal and therefore referred to cardiology for further evaluation and management.  Clinically she denies anginal chest pain or heart failure symptoms.  Benign essential hypertension: Unsure when she was diagnosed with hypertension.  Initially see states that she was diagnosed 3 months ago but then contradictory has been on antihypertensive medications for 6 months. She does not check her blood pressures at home. Does not consume frequent meals outside. She does consume frozen pizzas and canned tuna from time to time. Currently on losartan /hydrochlorothiazide 50/12.5 mg p.o. daily  No first degree relatives with premature coronary disease or sudden cardiac death.  Walks 2-3 x / week   Smokes approximately 0.2 packs/day and is trying to quit.  Her most recent lipids from May 2025 are significantly abnormal, states they were likely not fasting.  She had repeat labs about a month ago at the weight loss clinic I do not have  those available for review.  Current Medications: Current Meds  Medication Sig   Blood Pressure Monitoring (BLOOD PRESSURE KIT) KIT    Iron , Ferrous Sulfate , 325 (65 Fe) MG TABS Take 325 mg by mouth daily.   losartan -hydrochlorothiazide (HYZAAR) 50-12.5 MG tablet Take 1 tablet by mouth daily.   Omeprazole  20 MG TBEC 1 tab po at bedtime daily   semaglutide -weight management (WEGOVY ) 1 MG/0.5ML SOAJ SQ injection Inject 1 mg into the skin once a week.   Vitamin D , Ergocalciferol , (DRISDOL ) 1.25 MG (50000 UNIT) CAPS capsule Take 1 capsule (50,000 Units total) by mouth once a week.     Allergies:    Patient has no known allergies.   Past Medical History: Past Medical History:  Diagnosis Date   Anemia    Hypertension    Vitamin D  deficiency disease     Past Surgical History: Past Surgical History:  Procedure Laterality Date   CESAREAN SECTION     TUBAL LIGATION      Social History: Social History   Tobacco Use   Smoking status: Every Day    Current packs/day: 0.40    Types: Cigarettes   Smokeless tobacco: Never  Vaping Use   Vaping status: Never Used  Substance Use Topics   Alcohol use: Yes    Comment: occ   Drug use: No    Family History: Family History  Problem Relation Age of Onset   Heart failure Mother    Diabetes Mother    Hypertension Mother    Obesity Mother    Obesity  Father    Breast cancer Neg Hx     ROS:   Review of Systems  Cardiovascular:  Negative for chest pain, claudication, irregular heartbeat, leg swelling, near-syncope, orthopnea, palpitations, paroxysmal nocturnal dyspnea and syncope.  Respiratory:  Negative for shortness of breath.   Hematologic/Lymphatic: Negative for bleeding problem.    EKGs/Labs/Other Studies Reviewed:   EKG: EKG Interpretation Date/Time:  Monday October 26 2023 13:28:19 EDT Ventricular Rate:  74 PR Interval:  140 QRS Duration:  84 QT Interval:  396 QTC Calculation: 439 R Axis:   58  Text  Interpretation: Normal sinus rhythm Nonspecific T wave abnormality When compared with ECG of 27-Mar-2016 08:41, No significant change since last tracing Confirmed by Michele Richardson [id number]) on 10/26/2023 1:45:58 PM  Echocardiogram: None   Labs:    Latest Ref Rng & Units 06/18/2023   12:25 PM 03/27/2016    8:46 AM 06/26/2015    8:23 AM  CBC  WBC 3.4 - 10.8 x10E3/uL 8.8  7.5  9.1   Hemoglobin 11.1 - 15.9 g/dL 87.4  87.8  88.1   Hematocrit 34.0 - 46.6 % 38.3  37.5  37.0   Platelets 150 - 450 x10E3/uL 313  331  314        Latest Ref Rng & Units 09/28/2023   11:27 AM 06/18/2023   12:25 PM 03/27/2016    8:46 AM  BMP  Glucose 70 - 99 mg/dL 78  94  97   BUN 6 - 20 mg/dL 9  11  6    Creatinine 0.57 - 1.00 mg/dL 9.17  9.17  9.11   BUN/Creat Ratio 9 - 23 11  13     Sodium 134 - 144 mmol/L 136  135  137   Potassium 3.5 - 5.2 mmol/L 4.4  4.7  4.1   Chloride 96 - 106 mmol/L 99  99  102   CO2 20 - 29 mmol/L 19  21  25    Calcium 8.7 - 10.2 mg/dL 9.6  9.8  8.6       Latest Ref Rng & Units 09/28/2023   11:27 AM 06/18/2023   12:25 PM 03/27/2016    8:46 AM  CMP  Glucose 70 - 99 mg/dL 78  94  97   BUN 6 - 20 mg/dL 9  11  6    Creatinine 0.57 - 1.00 mg/dL 9.17  9.17  9.11   Sodium 134 - 144 mmol/L 136  135  137   Potassium 3.5 - 5.2 mmol/L 4.4  4.7  4.1   Chloride 96 - 106 mmol/L 99  99  102   CO2 20 - 29 mmol/L 19  21  25    Calcium 8.7 - 10.2 mg/dL 9.6  9.8  8.6   Total Protein 6.0 - 8.5 g/dL  7.5  5.7   Total Bilirubin 0.0 - 1.2 mg/dL  0.3  0.1   Alkaline Phos 44 - 121 IU/L  74  53   AST 0 - 40 IU/L  16  22   ALT 0 - 32 IU/L  18  22     Lab Results  Component Value Date   CHOL 244 (H) 06/18/2023   HDL 43 06/18/2023   LDLCALC 169 (H) 06/18/2023   TRIG 175 (H) 06/18/2023   CHOLHDL 5.7 (H) 06/18/2023   No results for input(s): LIPOA in the last 8760 hours. No components found for: NTPROBNP No results for input(s): PROBNP in the last 8760 hours. Recent Labs    06/18/23 1225  TSH 0.571     Physical Exam:    Today's Vitals   10/26/23 1325 10/26/23 1403  BP: (!) 167/112 (!) 145/88  Pulse: 77   Resp: 16   SpO2: 99%   Weight: 156 lb 6.4 oz (70.9 kg)   Height: 5' 3 (1.6 m)    Body mass index is 27.71 kg/m. Wt Readings from Last 3 Encounters:  10/26/23 156 lb 6.4 oz (70.9 kg)  09/28/23 160 lb (72.6 kg)  09/02/23 167 lb (75.8 kg)    Physical Exam  Constitutional: No distress.  hemodynamically stable  Neck: No JVD present.  Cardiovascular: Normal rate, regular rhythm, S1 normal and S2 normal. Exam reveals no gallop, no S3 and no S4.  No murmur heard. Pulmonary/Chest: Effort normal and breath sounds normal. No stridor. She has no wheezes. She has no rales.  Musculoskeletal:        General: No edema.     Cervical back: Neck supple.  Skin: Skin is warm.    Impression & Recommendation(s):  Impression:   ICD-10-CM   1. Nonspecific abnormal electrocardiogram (ECG) (EKG)  R94.31 EKG 12-Lead    ECHOCARDIOGRAM COMPLETE    2. Benign hypertension  I10     3. Continuous dependence on cigarette smoking  F17.210        Recommendation(s):  Nonspecific abnormal electrocardiogram (ECG) (EKG) Patient has nonspecific T wave changes on EKG likely secondary to underlying LVH per voltage criteria. Similar findings on prior tracings. Clinically denies anginal chest pain or heart failure symptoms. No family history of premature coronary artery disease, sudden cardiac death or cardiomyopathy. Reemphasized importance of monitoring her blood pressures at home and optimizing antihypertensive medications. Will hold off on stress testing in an asymptomatic patient  However, for further risk stratification will order an echocardiogram.   Benign hypertension Initial blood pressures were quite elevated, improved upon recheck towards the end of the visit. Currently on losartan /hydrochlorothiazide 50/12.5 mg p.o. daily. I have asked her to keep a log of her blood pressures at home  and to follow-up with PCP to see if further medication titration is warranted based on ambulatory blood pressure readings. Reemphasized importance of low-salt diet. Reemphasized reducing the consumption of frozen meals, canned tuna fish, etc. If blood pressures are still not well-controlled or she is on more than 3 antihypertensive medications then secondary workup for hypertension will be discussed at the next office visit.  Continuous dependence on cigarette smoking Currently smokes 0.2 packs/day. Focusing on complete smoking cessation  Orders Placed:  Orders Placed This Encounter  Procedures   EKG 12-Lead   ECHOCARDIOGRAM COMPLETE    Standing Status:   Future    Expiration Date:   10/25/2024    Where [facility] this test [facility] performed[facility]    Does the patient weigh less than or greater than 250 lbs?:   Patient weighs less than 250 lbs    Perflutren DEFINITY (image enhancing agent) should be administered unless hypersensitivity or allergy exist:   Administer Perflutren    Reason for exam-Echo:   Abnormal ECG  R94.31     Final Medication List:   No orders of the defined types were placed in this encounter.   Medications Discontinued During This Encounter  Medication Reason   hydrALAZINE (APRESOLINE) 25 MG tablet Patient Preference     Current Outpatient Medications:    Blood Pressure Monitoring (BLOOD PRESSURE KIT) KIT, , Disp: , Rfl:    Iron , Ferrous Sulfate , 325 (65 Fe)  MG TABS, Take 325 mg by mouth daily., Disp: 30 tablet, Rfl: 1   losartan -hydrochlorothiazide (HYZAAR) 50-12.5 MG tablet, Take 1 tablet by mouth daily., Disp: 30 tablet, Rfl: 2   Omeprazole  20 MG TBEC, 1 tab po at bedtime daily, Disp: 30 tablet, Rfl: 1   semaglutide -weight management (WEGOVY ) 1 MG/0.5ML SOAJ SQ injection, Inject 1 mg into the skin once a week., Disp: 2 mL, Rfl: 0   Vitamin D , Ergocalciferol , (DRISDOL ) 1.25 MG (50000 UNIT) CAPS capsule, Take 1 capsule (50,000 Units total) by mouth  once a week., Disp: 5 capsule, Rfl: 0  Consent:   N/A  Disposition:   30-month follow-up sooner if needed Patient may be asked to follow-up sooner based on the results of the above-mentioned testing.  Her questions and concerns were addressed to her satisfaction. She voices understanding of the recommendations provided during this encounter.    Signed, Madonna Michele HAS, [facility]  A Division of [facility] [address], [place], [place] [address]  10/26/2023 6:05 PM

## 2023-10-26 NOTE — Patient Instructions (Signed)
 Medication Instructions:  Your physician recommends that you continue on your current medications as directed. Please refer to the Current Medication list given to you today.  *If you need a refill on your cardiac medications before your next appointment, please call your pharmacy*  Lab Work: None.  If you have labs (blood work) drawn today and your tests are completely normal, you will receive your results only by: MyChart Message (if you have MyChart) OR A paper copy in the mail If you have any lab test that is abnormal or we need to change your treatment, we will call you to review the results.  Testing/Procedures: Your physician has requested that you have an echocardiogram. Echocardiography is a painless test that uses sound waves to create images of your heart. It provides your doctor with information about the size and shape of your heart and how well your heart's chambers and valves are working. This procedure takes approximately one hour. There are no restrictions for this procedure. Please do NOT wear cologne, perfume, aftershave, or lotions (deodorant is allowed). Please arrive 15 minutes prior to your appointment time.  Please note: We ask at that you not bring children with you during ultrasound (echo/ vascular) testing. Due to room size and safety concerns, children are not allowed in the ultrasound rooms during exams. Our front office staff cannot provide observation of children in our lobby area while testing is being conducted. An adult accompanying a patient to their appointment will only be allowed in the ultrasound room at the discretion of the ultrasound technician under special circumstances. We apologize for any inconvenience.   Follow-Up: At [facility], you and your health needs are our priority.  As part of our continuing mission to provide you with exceptional heart care, our providers are all part of one team.  This team includes your primary Cardiologist  (physician) and Advanced Practice Providers or APPs (Physician Assistants and Nurse Practitioners) who all work together to provide you with the care you need, when you need it.  Your next appointment:   6 month(s) (sooner if needed)  Provider:   Dr. Madonna Large   We recommend signing up for the patient portal called MyChart.  Sign up information is provided on this After Visit Summary.  MyChart is used to connect with patients for Virtual Visits (Telemedicine).  Patients are able to view lab/test results, encounter notes, upcoming appointments, etc.  Non-urgent messages can be sent to your provider as well.   To learn more about what you can do with MyChart, go to [email or web address].   Other Instructions Please start checking your blood pressure daily, 2-3 hours after you take any medication for your blood pressure. Write down the date and time of the reading as well as your heart rate if your machine provides one. Review your blood pressure readings with your PCP.

## 2023-10-27 ENCOUNTER — Encounter: Payer: Self-pay | Admitting: Family Medicine

## 2023-10-27 ENCOUNTER — Ambulatory Visit: Admitting: Family Medicine

## 2023-10-27 VITALS — BP 138/86 | HR 72 | Temp 98.2°F | Resp 16 | Ht 63.0 in | Wt 151.0 lb

## 2023-10-27 DIAGNOSIS — E559 Vitamin D deficiency, unspecified: Secondary | ICD-10-CM

## 2023-10-27 DIAGNOSIS — I10 Essential (primary) hypertension: Secondary | ICD-10-CM

## 2023-10-27 DIAGNOSIS — Z6826 Body mass index (BMI) 26.0-26.9, adult: Secondary | ICD-10-CM

## 2023-10-27 DIAGNOSIS — F17200 Nicotine dependence, unspecified, uncomplicated: Secondary | ICD-10-CM

## 2023-10-27 DIAGNOSIS — R7303 Prediabetes: Secondary | ICD-10-CM | POA: Diagnosis not present

## 2023-10-27 DIAGNOSIS — E663 Overweight: Secondary | ICD-10-CM | POA: Diagnosis not present

## 2023-10-27 DIAGNOSIS — D500 Iron deficiency anemia secondary to blood loss (chronic): Secondary | ICD-10-CM

## 2023-10-27 MED ORDER — IRON (FERROUS SULFATE) 325 (65 FE) MG PO TABS: 325.0000 mg | ORAL_TABLET | Freq: Every day | ORAL | 1 refills | Status: DC

## 2023-10-27 MED ORDER — WEGOVY 0.5 MG/0.5ML ~~LOC~~ SOAJ: 0.5000 mg | SUBCUTANEOUS | 0 refills | Status: DC

## 2023-10-27 MED ORDER — BUPROPION HCL ER (SR) 100 MG PO TB12: 100.0000 mg | ORAL_TABLET | Freq: Two times a day (BID) | ORAL | 1 refills | Status: DC

## 2023-10-27 NOTE — Patient Instructions (Signed)
 Reduce Wegovy  to 0.5 mg weekly  Continue RX iron  daily  Stop RX vitamin D  and move to vitamin D  2,000 international units  daily - gummies are fine  Echocardiogram coming up  Begin Wellbutrin  SR 100 mg -- start with 1 tab in the morning x 1 week then increase to 1 tab twice a day  Remember your fruits and veggies

## 2023-10-27 NOTE — Progress Notes (Signed)
 Office: [phone]  /  Fax: [phone]  WEIGHT SUMMARY AND BIOMETRICS  Starting Date: 06/18/23  Starting Weight: 177lb   Weight Lost Since Last Visit: 9lb   Vitals Temp: 98.2 F (36.8 C) BP: 138/86 Pulse Rate: 72 SpO2: 100 %   Body Composition  Body Fat %: 32.7 % Fat Mass (lbs): 49.6 lbs Muscle Mass (lbs): 96.6 lbs Total Body Water (lbs): 63.6 lbs Visceral Fat Rating : 5     HPI  Chief Complaint: OBESITY  Tiffany Weiss is here to discuss her progress with her obesity treatment plan. She is on the the Category 3 Plan and states she is following her eating plan approximately 75 % of the time. She states she is exercising 30-60 minutes 2-3 times per week.  Interval History:  Since last office visit she is down 9 lb This gives her a net weight loss of 26 lb in 4 mos of medically supervised weight management This is a 14% TBW loss She did go up on Wegovy  to 1 mg dose 4 weeks ago and is having problems forcing herself to eat 2-3 days post injection Denies abdominal pain or vomiting but has some slight nausea She has been working on getting in more lean protein but has been lacking fiber intake She is walking and doing weight training at home Stress at home remains high with her fiance's lung cancer diagnosis She is down 6.6 lb of muscle mass since last month She is working on smoking cessation  Pharmacotherapy: Wegovy  1 mg weekly  PHYSICAL EXAM:  Blood pressure 138/86, pulse 72, temperature 98.2 F (36.8 C), height 5' 3 (1.6 m), weight 151 lb (68.5 kg), SpO2 100%. Body mass index is 26.75 kg/m.  General: She is healthy appearing, cooperative, alert, well developed, and in no acute distress. PSYCH: Has normal mood, affect and thought process.   Lungs: Normal breathing effort, no conversational dyspnea.   ASSESSMENT AND PLAN  TREATMENT PLAN FOR OBESITY:  Recommended Dietary Goals  Ellisyn is currently in the action stage of change. As such, her goal is to  continue weight management plan. She has agreed to keeping a food journal and adhering to recommended goals of 1400 calories and 90 g of  protein and practicing portion control and making smarter food choices, such as increasing vegetables and decreasing simple carbohydrates. Supplement skipping meals with a protein shake Change juicing fruits and veggies to eating fruits and veggies  Behavioral Intervention  We discussed the following Behavioral Modification Strategies today: increasing lean protein intake to established goals, increasing fiber rich foods, avoiding skipping meals, increasing water intake , work on managing stress, creating time for self-care and relaxation, avoiding temptations and identifying enticing environmental cues, continue to practice mindfulness when eating, continue to work on maintaining a reduced calorie state, getting the recommended amount of protein, incorporating whole foods, making healthy choices, staying well hydrated and practicing mindfulness when eating., and increase protein intake, fibrous foods (25 grams per day for women, 30 grams for men) and water to improve satiety and decrease hunger signals. .  Additional resources provided today: NA  Recommended Physical Activity Goals  [name] has been advised to work up to 150 minutes of moderate intensity aerobic activity a week and strengthening exercises 2-3 times per week for cardiovascular health, weight loss maintenance and preservation of muscle mass.   She has agreed to [employer] strengthening exercises with a goal of 2-3 sessions a week  and Increase and monitor steps for a goal of 10,000  per day  Pharmacotherapy changes for the treatment of obesity: reduce Wegovy  to 0.5 mg weekly due to excessive satiety and loss of muscle mass  ASSOCIATED CONDITIONS ADDRESSED TODAY  Smoking addiction Feels ready to work on smoking cessation  Has ordered a quit smoking kit Avoid nicotine replacement with  HTN Previously did well on Wellbutrin  Start: -     buPROPion  HCl ER (SR); Take 1 tablet (100 mg total) by mouth 2 (two) times daily.  Dispense: 60 tablet; Refill: 1 -     Amb Referral To Provider Referral Exercise Program (P.R.E.P)  Overweight (BMI 25.0-29.9) -     Wegovy ; Inject 0.5 mg into the skin once a week.  Dispense: 2 mL; Refill: 0 Due to excessive satiety, mild GI upset and loss of muscle mass, Zenith needs a dose de-escalation on Wegovy  to 0.5 mg weekly Reviewed plan of care on AVS with patient  Prediabetes Lab Results  Component Value Date   HGBA1C 5.5 09/28/2023  Improved Reviewed labs with patient from last visit Doing well with weight reduction and cutting back on sugar and starch intake Ramp up walking and weight lifting time -     Wegovy ; Inject 0.5 mg into the skin once a week.  Dispense: 2 mL; Refill: 0 -     Amb Referral To Provider Referral Exercise Program (P.R.E.P)  Vitamin D  deficiency Improving Reviewed labs from last visit D/c vitamin D  RX and change to OTC vitamin D  2,000 international units daily for maintenance  HTN Improving BP still slightly high today on Losartan  hydrochlorothiazide 50/12.5 mg daily Echocardiogram ordered by cardiology for 10/13 Continue active plan for smoking cessation, weight reduction and continue current meds F/u with PCP  Iron  deficiency anemia due to chronic blood loss Improving Lab Results  Component Value Date   IRON  129 09/28/2023   TIBC 433 09/28/2023   FERRITIN 31 09/28/2023  Reviewed lab results with patient Tolerating RX iron  once daily w/o problem Energy level improving Has heavy menses and has had eval with Obgyn for this Reports neg eval for uterine fibroids Continue oral RX iron  once daily -     Iron  (Ferrous Sulfate ); Take 325 mg by mouth daily.  Dispense: 90 tablet; Refill: 1       She was informed of the importance of frequent follow up visits to maximize her success with intensive lifestyle  modifications for her multiple health conditions.   ATTESTASTION STATEMENTS:  Reviewed by clinician on day of visit: allergies, medications, problem list, medical history, surgical history, family history, social history, and previous encounter notes pertinent to obesity diagnosis.   I have personally spent 30 minutes total time today in preparation, patient care, nutritional counseling and education,  and documentation for this visit, including the following: review of most recent clinical lab tests, prescribing medications/ refilling medications, reviewing medical assistant documentation, review and interpretation of bioimpedence results.     Darice Haddock, D.O. DABFM, DABOM [facility] [address] [place], [place] [address] [phone]

## 2023-10-30 ENCOUNTER — Ambulatory Visit: Admitting: Podiatry

## 2023-11-04 ENCOUNTER — Ambulatory Visit: Admitting: Podiatry

## 2023-11-11 ENCOUNTER — Ambulatory Visit: Admitting: Podiatry

## 2023-11-13 ENCOUNTER — Ambulatory Visit: Admitting: Podiatry

## 2023-11-23 ENCOUNTER — Ambulatory Visit: Admitting: Family Medicine

## 2023-11-24 ENCOUNTER — Ambulatory Visit: Admitting: Family Medicine

## 2023-11-24 ENCOUNTER — Encounter: Payer: Self-pay | Admitting: Family Medicine

## 2023-11-24 VITALS — BP 158/87 | HR 62 | Temp 98.8°F | Ht 63.0 in | Wt 145.0 lb

## 2023-11-24 DIAGNOSIS — F17200 Nicotine dependence, unspecified, uncomplicated: Secondary | ICD-10-CM | POA: Diagnosis not present

## 2023-11-24 DIAGNOSIS — F4320 Adjustment disorder, unspecified: Secondary | ICD-10-CM | POA: Diagnosis not present

## 2023-11-24 DIAGNOSIS — D500 Iron deficiency anemia secondary to blood loss (chronic): Secondary | ICD-10-CM

## 2023-11-24 DIAGNOSIS — I10 Essential (primary) hypertension: Secondary | ICD-10-CM

## 2023-11-24 DIAGNOSIS — Z6825 Body mass index (BMI) 25.0-25.9, adult: Secondary | ICD-10-CM

## 2023-11-24 DIAGNOSIS — E663 Overweight: Secondary | ICD-10-CM

## 2023-11-24 MED ORDER — BUPROPION HCL ER (SR) 100 MG PO TB12: 100.0000 mg | ORAL_TABLET | Freq: Two times a day (BID) | ORAL | 1 refills | Status: AC

## 2023-11-24 MED ORDER — IRON (FERROUS SULFATE) 325 (65 FE) MG PO TABS: 325.0000 mg | ORAL_TABLET | Freq: Every day | ORAL | 1 refills | Status: AC

## 2023-11-24 MED ORDER — LOSARTAN POTASSIUM-HCTZ 50-12.5 MG PO TABS: 1.0000 | ORAL_TABLET | Freq: Every day | ORAL | 2 refills | Status: AC

## 2023-11-24 NOTE — Progress Notes (Signed)
 Office: [phone]  /  Fax: [phone]  WEIGHT SUMMARY AND BIOMETRICS  Starting Date: 06/18/23  Starting Weight: 177lb   Weight Lost Since Last Visit: 6lb   Vitals Temp: 98.8 F (37.1 C) BP: (!) 158/87 Pulse Rate: 62 SpO2: 98 %   Body Composition  Body Fat %: 31.8 % Fat Mass (lbs): 46.4 lbs Muscle Mass (lbs): 94.2 lbs Total Body Water (lbs): 64.6 lbs Visceral Fat Rating : 5     HPI  Chief Complaint: OBESITY  Tiffany Weiss is here to discuss her progress with her obesity treatment plan. She is on the the Category 3 Plan and states she is following her eating plan approximately 45-50 % of the time. She states she is exercising 30 minutes 2-3 times per week.   Interval History:  Since last office visit she is down 6 lb She is down 2.4 pounds of muscle mass and down 3.2 pounds of body fat since last visit This gives her a net weight loss of 32 pounds in the past 5 months and medically supervised weight management This is an 18% total body weight loss She did reduce her Wegovy  from 1 mg to 0.5 mg once weekly injection last visit due to excess  muscle loss She has 1 more injection left She is happy at her current weight She plans to add in gym workouts at [employer] and begin outdoor walking She has been trying her best to eat on a schedule and incorporate lean protein and fiber with meals She has been grieving the loss of her fianc who died of lung cancer last month She is continuing to work on smoking cessation  Pharmacotherapy: Wegovy  0.5 mg once weekly injection  PHYSICAL EXAM:  Blood pressure (!) 158/87, pulse 62, temperature 98.8 F (37.1 C), height 5' 3 (1.6 m), weight 145 lb (65.8 kg), SpO2 98%. Body mass index is 25.69 kg/m.  General: She is healthy appearing, cooperative, alert, well developed, and in no acute distress. PSYCH: Has normal mood, affect and thought process.   Lungs: Normal breathing effort, no conversational dyspnea.   ASSESSMENT AND  PLAN  TREATMENT PLAN FOR OBESITY:  Recommended Dietary Goals  Tiffany Weiss is currently in the action stage of change. As such, her goal is to continue weight management plan. She has agreed to practicing portion control and making smarter food choices, such as increasing vegetables and decreasing simple carbohydrates.  Behavioral Intervention  We discussed the following Behavioral Modification Strategies today: increasing lean protein intake to established goals, increasing fiber rich foods, avoiding skipping meals, increasing water intake , work on meal planning and preparation, keeping healthy foods at home, practice mindfulness eating and understand the difference between hunger signals and cravings, work on managing stress, creating time for self-care and relaxation, avoiding temptations and identifying enticing environmental cues, and planning for success.  Additional resources provided today: NA  Recommended Physical Activity Goals  Tiffany Weiss has been advised to work up to 150 minutes of moderate intensity aerobic activity a week and strengthening exercises 2-3 times per week for cardiovascular health, weight loss maintenance and preservation of muscle mass.   She has agreed to [employer] strengthening exercises with a goal of 2-3 sessions a week  and Start aerobic activity with a goal of 150 minutes a week at moderate intensity.   Pharmacotherapy changes for the treatment of obesity: Complete last week of Wegovy  0.5 mg injection then discontinue, no longer covered by insurance  ASSOCIATED CONDITIONS ADDRESSED TODAY  Smoking addiction She has been  able to cut back on smoking down to 1 cigarette daily on bupropion  SR 100 mg twice daily.  She denies adverse side effects from bupropion .  She remains motivated to quit smoking after seeing her fianc passed away from lung cancer. -     buPROPion  HCl ER (SR); Take 1 tablet (100 mg total) by mouth 2 (two) times daily.  Dispense: 60 tablet; Refill:  1  Primary hypertension Her blood pressure is elevated today and she is still experiencing some stress.  She has done well on losartan /HCTZ 50/12.5 mg once daily.  This may be taken over by her PCP at her upcoming visit.  She is scheduled for an echocardiogram with cardiology -     Losartan  Potassium-HCTZ; Take 1 tablet by mouth daily.  Dispense: 30 tablet; Refill: 2  Iron  deficiency anemia due to chronic blood loss Lab Results  Component Value Date   IRON  129 09/28/2023   TIBC 433 09/28/2023   FERRITIN 31 09/28/2023  She remains on oral iron  once daily without adverse side effects.  Her energy level has improved.  Plan to recheck levels in January.  -     Iron  (Ferrous Sulfate ); Take 325 mg by mouth daily.  Dispense: 90 tablet; Refill: 1  Overweight (BMI 25.0-29.9) Improved.  Reviewed results on her bioimpedance.  She is at her goal weight now and will work on maintenance of weight loss especially coming off of Wegovy .  Reviewed plan of care on after visit summary.  Commended her on her progress.  She remains motivated to stay at her current body weight.  BMI 25.0-25.9,adult  Grief reaction She has the support of her children after the loss of her fianc to lung cancer.  She has taken some time off work.  She is not close to her family including her parents or siblings.  She has been focusing on self-care, nutritious eating and we discussed adding in some outdoor walking for mental health.     She was informed of the importance of frequent follow up visits to maximize her success with intensive lifestyle modifications for her multiple health conditions.   ATTESTASTION STATEMENTS:  Reviewed by clinician on day of visit: allergies, medications, problem list, medical history, surgical history, family history, social history, and previous encounter notes pertinent to obesity diagnosis.   I have personally spent 30 minutes total time today in preparation, patient care, nutritional  counseling and education,  and documentation for this visit, including the following: review of most recent clinical lab tests, prescribing medications/ refilling medications, reviewing medical assistant documentation, review and interpretation of bioimpedence results.     Darice Haddock, D.O. DABFM, DABOM [facility] [address] [place], [place] [address] [phone]

## 2023-11-24 NOTE — Patient Instructions (Signed)
 You can finish out your last Wegovy  pen  Continue to work on:  Eating on a schedule Lean protein + fiber with meals  Hydrate well with water Keep sweets to a minimum  Plan to add in gym workouts in the next 2 weeks Outdoor walking goal: 30-60 min 3+ days/ wk  [place] job with smoking cessation!

## 2023-11-27 ENCOUNTER — Ambulatory Visit: Admitting: Podiatry

## 2023-11-30 ENCOUNTER — Other Ambulatory Visit (HOSPITAL_COMMUNITY)

## 2023-12-15 ENCOUNTER — Ambulatory Visit (HOSPITAL_COMMUNITY)

## 2023-12-25 ENCOUNTER — Other Ambulatory Visit: Payer: Self-pay | Admitting: Family Medicine

## 2023-12-25 DIAGNOSIS — E559 Vitamin D deficiency, unspecified: Secondary | ICD-10-CM

## 2024-01-06 ENCOUNTER — Ambulatory Visit: Admitting: Family Medicine

## 2024-01-28 ENCOUNTER — Ambulatory Visit (HOSPITAL_COMMUNITY)

## 2024-01-28 ENCOUNTER — Encounter (HOSPITAL_COMMUNITY): Payer: Self-pay

## 2024-01-28 ENCOUNTER — Telehealth (HOSPITAL_COMMUNITY): Payer: Self-pay | Admitting: Cardiology

## 2024-01-28 NOTE — Telephone Encounter (Signed)
 patient cancelled same day echocardiogram and will call back to reschedule @ 7:48 am. Order will be removed from the active echo WQ.

## 2024-03-02 ENCOUNTER — Other Ambulatory Visit: Payer: Self-pay | Admitting: Family Medicine

## 2024-03-02 DIAGNOSIS — Z1231 Encounter for screening mammogram for malignant neoplasm of breast: Secondary | ICD-10-CM

## 2024-03-18 ENCOUNTER — Ambulatory Visit
